# Patient Record
Sex: Male | Born: 1950 | Race: White | Hispanic: No | Marital: Married | Smoking: Former smoker
Health system: Southern US, Community
[De-identification: ages and names within clinical notes are randomized; demographics above are authoritative.]

## PROBLEM LIST (undated history)

## (undated) DIAGNOSIS — K469 Unspecified abdominal hernia without obstruction or gangrene: Secondary | ICD-10-CM

## (undated) DIAGNOSIS — R51 Headache: Secondary | ICD-10-CM

## (undated) DIAGNOSIS — Z87442 Personal history of urinary calculi: Secondary | ICD-10-CM

## (undated) DIAGNOSIS — R079 Chest pain, unspecified: Secondary | ICD-10-CM

## (undated) DIAGNOSIS — E782 Mixed hyperlipidemia: Secondary | ICD-10-CM

## (undated) DIAGNOSIS — R519 Headache, unspecified: Secondary | ICD-10-CM

## (undated) DIAGNOSIS — R0600 Dyspnea, unspecified: Secondary | ICD-10-CM

## (undated) DIAGNOSIS — I350 Nonrheumatic aortic (valve) stenosis: Secondary | ICD-10-CM

## (undated) DIAGNOSIS — I10 Essential (primary) hypertension: Secondary | ICD-10-CM

## (undated) DIAGNOSIS — R0982 Postnasal drip: Secondary | ICD-10-CM

## (undated) DIAGNOSIS — I2510 Atherosclerotic heart disease of native coronary artery without angina pectoris: Secondary | ICD-10-CM

## (undated) DIAGNOSIS — Z955 Presence of coronary angioplasty implant and graft: Secondary | ICD-10-CM

## (undated) HISTORY — PX: TONSILLECTOMY: SUR1361

## (undated) HISTORY — DX: Chest pain, unspecified: R07.9

## (undated) HISTORY — DX: Presence of coronary angioplasty implant and graft: Z95.5

## (undated) HISTORY — DX: Essential (primary) hypertension: I10

## (undated) HISTORY — DX: Mixed hyperlipidemia: E78.2

## (undated) HISTORY — PX: HERNIA REPAIR: SHX51

---

## 2008-10-18 ENCOUNTER — Inpatient Hospital Stay (HOSPITAL_BASED_OUTPATIENT_CLINIC_OR_DEPARTMENT_OTHER): Admission: RE | Admit: 2008-10-18 | Discharge: 2008-10-18 | Payer: Self-pay | Admitting: Interventional Cardiology

## 2008-10-26 ENCOUNTER — Ambulatory Visit (HOSPITAL_COMMUNITY): Admission: RE | Admit: 2008-10-26 | Discharge: 2008-10-27 | Payer: Self-pay | Admitting: Interventional Cardiology

## 2010-09-02 LAB — BASIC METABOLIC PANEL
BUN: 11 mg/dL (ref 6–23)
CO2: 24 mEq/L (ref 19–32)
Chloride: 100 mEq/L (ref 96–112)
Creatinine, Ser: 0.95 mg/dL (ref 0.4–1.5)
Glucose, Bld: 116 mg/dL — ABNORMAL HIGH (ref 70–99)

## 2010-09-02 LAB — CBC
HCT: 41.8 % (ref 39.0–52.0)
Hemoglobin: 14.6 g/dL (ref 13.0–17.0)
MCV: 88.6 fL (ref 78.0–100.0)
RBC: 4.73 MIL/uL (ref 4.22–5.81)
WBC: 10.3 10*3/uL (ref 4.0–10.5)

## 2010-09-03 LAB — POCT I-STAT 3, ART BLOOD GAS (G3+)
Acid-Base Excess: 1 mmol/L (ref 0.0–2.0)
pH, Arterial: 7.377 (ref 7.350–7.450)

## 2010-09-03 LAB — POCT I-STAT 3, VENOUS BLOOD GAS (G3P V)
O2 Saturation: 69 %
TCO2: 26 mmol/L (ref 0–100)

## 2010-10-08 NOTE — Discharge Summary (Signed)
Joseph Morse              ACCOUNT NO.:  [id number]   MEDICAL RECORD NO.:  [id number]          PATIENT TYPE:  INP   LOCATION:  2503                         FACILITY:  [facility]   PHYSICIAN:  Corky Crafts, MDDATE OF BIRTH:  07-02-1950   DATE OF ADMISSION:  10/26/2008  DATE OF DISCHARGE:  10/27/2008                               DISCHARGE SUMMARY   DISCHARGE DIAGNOSES:  1. Coronary artery disease, status post drug-eluting stent to the      right coronary artery x2.  2. Heart murmur.  3. Nonspecific abnormal electrocardiogram.  4. Hypertension.  5. Long-term medication use.   Joseph Morse is a 60 year old male patient who was referred from Dr.  Tiburcio Morse with a heart murmur and a left bundle-branch block with a history  of hypertension and family history of heart disease.  A 2-D echo was  performed and he was diagnosed with grade 1 diastolic dysfunction  without left atrial pressure elevation.  There was moderate aortic valve  regurgitation and mild-to-moderate aortic valve stenosis by echo.   He then had a nuclear stress test which was suggestive of ischemia,  leading to a catheterization on October 26, 2008.  He was found to have 80%  mid to distal right coronary artery lesion.  Two drug-eluting stents  were implanted to this area without difficulty.  He remained in the  hospital overnight and was ready for discharge to home the following  day.   LABORATORY STUDIES:  Sodium 133, potassium 4.0, BUN 11, and creatinine  0.95.  Hemoglobin 14.6, hematocrit 41.8, white count 10.3, and platelets  240.   DISCHARGE MEDICATIONS:  1. Plavix 75 mg a day.  2. Enteric-coated aspirin 325 mg a day.  3. Simvastatin 20 mg a day.  4. Lisinopril 10 mg a day.  5. Sublingual nitroglycerin p.r.n. chest pain.  6. Xanax p.r.n.   The patient is to remain on a low-sodium, heart-healthy diet.  Clean  cath site gently with soap and water.  No scrubbing.  Increase activity  slowly as per cardiac  rehabilitation.  No lifting over 10 pounds for 1  week.  No driving for 2 days.  Return to see Dr. Eldridge Dace, on November 10, 2008, at 11 a.m.  The patient is discharged to home in stable, but  improved condition.      Guy Franco, P.A.      Corky Crafts, MD  Electronically Signed    LB/MEDQ  D:  10/27/2008  T:  10/28/2008  Job:  [id number]   cc:   Joseph Morse, M.D.

## 2010-10-08 NOTE — Cardiovascular Report (Signed)
Joseph Morse, Joseph Morse              ACCOUNT NO.:  [id number]   MEDICAL RECORD NO.:  [id number]          PATIENT TYPE:  INP   LOCATION:  2503                         FACILITY:  [facility]   PHYSICIAN:  Corky Crafts, MDDATE OF BIRTH:  07/06/1950   DATE OF PROCEDURE:  10/26/2008  DATE OF DISCHARGE:                            CARDIAC CATHETERIZATION   REFERRING PHYSICIAN:  Holley Bouche, MD   PROCEDURE PERFORMED:  Percutaneous coronary intervention of the right  coronary artery, aortic arch aortogram.   OPERATOR:  Corky Crafts, MD   INDICATIONS:  Coronary artery disease, angina, bicuspid aortic valve.   PROCEDURE NARRATIVE:  The risks and benefits of cardiac catheterization  were explained to the patient and informed consent was obtained.  He was  brought to the cath lab.  He was prepped and draped in the usual sterile  fashion.  His right groin was infiltrated with 1% lidocaine.  A 6-French  sheath was placed into the right femoral artery using the modified  Seldinger technique.  A JR-4 guiding catheter was used to engage the  ostium of the right coronary artery.  A Prowater wire was placed down  the vessel and a BMW wire was then placed down the vessel.  A 2.5 x 15  apex balloon was placed across the diseased area and inflated 10  atmospheres for 23 seconds, 12 atmospheres for 42 seconds, 14  atmospheres for 32 seconds.  A 3.0 x 24-mm Endeavor stent was placed  into the more distal portion of the lesion and deployed at 15  atmospheres for 53 seconds.  A 3.0 x 18 Endeavor was then over placed in  an overlapping fashion more proximally and deployed at 16 atmospheres  for 38 seconds.  The entire stented area was postdilated with a 3.5 x 20  [place] balloon, inflated to 18 atmospheres for 30 seconds, 29  seconds, and then again for 29 seconds.  The lesion length was about 33  mm.  TIMI III flow was maintained throughout.  The 80-90% stenosis was  reduced to 0%.  The  ascending aortogram showed 2+ aortic insufficiency.  The aortic root  size looked at the upper limits of normal.  A StarClose was deployed to  the right femoral artery for hemostasis.  The patient tolerated the  procedure well.   IMPRESSION:  1. Successful percutaneous coronary intervention of the right coronary      artery with two drug-eluting stents in an overlapping fashion.      Both postdilated to greater than 3.5 mm in diameter.  2. 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   RECOMMENDATIONS:  Continue aspirin and Plavix indefinitely.  The patient  will be watched overnight in the hospital.  We will continue to follow  his bicuspid aortic valve.      Corky Crafts, MD  Electronically Signed    JSV/MEDQ  D:  10/26/2008  T:  10/26/2008  Job:  [id number]

## 2010-10-08 NOTE — Cardiovascular Report (Signed)
Joseph Morse, Joseph Morse              ACCOUNT NO.:  [id number]   MEDICAL RECORD NO.:  [id number]          PATIENT TYPE:  OIB   LOCATION:  1963                         FACILITY:  [facility]   PHYSICIAN:  Corky Crafts, MDDATE OF BIRTH:  08/27/1950   DATE OF PROCEDURE:  10/18/2008  DATE OF DISCHARGE:  10/18/2008                            CARDIAC CATHETERIZATION   REFERRING PHYSICIAN:  Holley Bouche, MD   PROCEDURES PERFORMED:  1. Left heart catheterization.  2. Right heart catheterization.  3. Coronary angiogram.  4. Left ventriculogram.  5. Abdominal aortogram.   INDICATIONS:  Chest pain, bicuspid aortic valve.   PROCEDURE NARRATIVE:  The risks and benefits of cardiac catheterization  were explained to the patient and informed consent was obtained.  He was  brought to the cath lab.  He was prepped and draped in the usual sterile  fashion.  His right groin was infiltrated with 1% lidocaine.  A 7-French  sheath was placed into the right femoral vein using the modified  Seldinger technique.  A 4-French sheath was placed into the right  femoral artery using the modified Seldinger technique.  A Swan-Ganz  catheter was advanced to the pulmonary artery under fluoroscopic  guidance.  Cardiac output measurements were performed.  PA oxygen  saturation was performed.  The catheter was pulled back under continuous  hemodynamic pressure monitoring to obtain right heart pressures.  The  catheter was removed.  A JL-4.0 catheter was used to engage the left  main coronary artery.  Digital angiography was performed in multiple  projections using hand injection of contrast.  A JR-4 catheter was used  to engage the right coronary artery.  Digital angiography was performed  in multiple projections using hand injection of contrast.  Ultimately,  an 0.035 straight wire and AL-1 catheter were used to cross the aortic  valve.  A pigtail was advanced over the J-wire which had been used to  replace a  straight wire and a left ventriculogram was performed.  The  catheter was pulled back under continuous hemodynamic pressure  monitoring.  The catheter was withdrawn to the abdominal aorta and a  power injection of contrast was performed.  The sheaths were removed  using manual compression.   FINDINGS:  The left main was a large vessel with mild irregularities.   The left circumflex was a large vessel which had an aneurysmal segment  in the mid vessel.  There appeared to be an underlying 60-70% stenosis.  The OM-1 was a large branching vessel with mild irregularities.  The  continuation of the circumflex had about a 90% ostial lesion after the  OM-1 but this was a small vessel.   The left anterior descending had mild-to-moderate diffuse  atherosclerosis.  The first and second diagonal vessels were medium-  sized vessels and widely patent.  The LAD did wrap around the apex.   The right coronary artery was a large dominant vessel with a diffuse 70-  80% midvessel stenosis.  There is mild atherosclerosis in the PDA and  posterolateral artery.   Left ventriculogram showed low normal left ventricular function with an  LVEF of 50%.   HEMODYNAMIC RESULTS:  Right atrial pressure 9/8 with a mean right atrial  pressure of 7 mmHg.  Cardiac output 4.7 L/min.  Cardiac index 2.4.  PA  saturation 69%.  PA pressure 26/8 with a mean PA pressure of 16 mmHg.  Pulmonary capillary wedge pressure 12/9 with a mean wedge of 7 mmHg.  RV  pressure 29/5 with an RVEDP of 7 mmHg.   Left ventricular pressure 145/15 with a left ventricular end-diastolic  pressure of 17 mmHg.  Aortic pressure 142/67 with a mean aortic pressure  of 95 mmHg.   Abdominal aortogram showed mild atherosclerosis and no significant renal  artery stenosis.  No abdominal aortic aneurysm.   IMPRESSION:  1. Significant coronary disease involving the right coronary artery.      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  2. There is no significant aortic stenosis.  3. Normal right heart pressures.   RECOMMENDATIONS:  We will start Plavix at this time and plan for PCI of  the right coronary artery next week.  We will try to medically manage  circumflex disease.  The aneurysm would make any type of percutaneous  intervention somewhat more complicated.  We will continue to follow  along for his bicuspid aortic valve.      Corky Crafts, MD  Electronically Signed     Corky Crafts, MD  Electronically Signed    JSV/MEDQ  D:  11/09/2008  T:  11/10/2008  Job:  [id number]

## 2013-02-22 ENCOUNTER — Other Ambulatory Visit: Payer: Self-pay | Admitting: Interventional Cardiology

## 2013-02-22 DIAGNOSIS — E782 Mixed hyperlipidemia: Secondary | ICD-10-CM

## 2013-02-22 DIAGNOSIS — Z79899 Other long term (current) drug therapy: Secondary | ICD-10-CM

## 2013-02-25 ENCOUNTER — Other Ambulatory Visit: Payer: Self-pay

## 2013-03-18 ENCOUNTER — Other Ambulatory Visit: Payer: Self-pay | Admitting: Interventional Cardiology

## 2013-04-20 ENCOUNTER — Other Ambulatory Visit: Payer: Self-pay | Admitting: Interventional Cardiology

## 2013-05-06 ENCOUNTER — Other Ambulatory Visit: Payer: Self-pay | Admitting: Interventional Cardiology

## 2013-05-22 ENCOUNTER — Other Ambulatory Visit: Payer: Self-pay | Admitting: Interventional Cardiology

## 2013-05-24 ENCOUNTER — Other Ambulatory Visit: Payer: Self-pay

## 2013-06-02 ENCOUNTER — Other Ambulatory Visit (INDEPENDENT_AMBULATORY_CARE_PROVIDER_SITE_OTHER): Payer: 59

## 2013-06-02 DIAGNOSIS — Z79899 Other long term (current) drug therapy: Secondary | ICD-10-CM

## 2013-06-02 DIAGNOSIS — E782 Mixed hyperlipidemia: Secondary | ICD-10-CM

## 2013-06-02 LAB — LIPID PANEL
CHOL/HDL RATIO: 3
Cholesterol: 124 mg/dL (ref 0–200)
HDL: 43.1 mg/dL (ref >39.00)
LDL CALC: 71 mg/dL (ref 0–99)
TRIGLYCERIDES: 52 mg/dL (ref 0.0–149.0)
VLDL: 10.4 mg/dL (ref 0.0–40.0)

## 2013-06-02 LAB — ALT: ALT: 21 U/L (ref 0–53)

## 2013-06-08 ENCOUNTER — Telehealth: Payer: Self-pay | Admitting: *Deleted

## 2013-06-08 ENCOUNTER — Telehealth: Payer: Self-pay | Admitting: Interventional Cardiology

## 2013-06-08 ENCOUNTER — Telehealth: Payer: Self-pay | Admitting: Cardiology

## 2013-06-08 DIAGNOSIS — E782 Mixed hyperlipidemia: Secondary | ICD-10-CM

## 2013-06-08 MED ORDER — ATORVASTATIN CALCIUM 40 MG PO TABS: ORAL_TABLET | ORAL | Status: DC

## 2013-06-08 NOTE — Telephone Encounter (Signed)
Labs ordered.

## 2013-06-08 NOTE — Telephone Encounter (Signed)
Message copied by Theda SersSTEGALL, Tricia [place] H on Wed Jun 08, 2013  2:17 PM ------      Message from: [place], [place]      Created: Fri Jun 03, 2013  4:38 PM       LDL goal < 70, non-HDL goal < 100,  given h/o CAD.      Meds:  Lipitor 40 mg qd.      LDL historically ~ 65 mg/dL on lipitor 40 mg qd, and up to 71 mg/dL today.  Non-HDL is well below 100 mg/dL still, so will continue same dose.      Plan:      1. No change in meds.      2. Check an NMR LipoProfile and hepatic panel in 6 months to assess LDL-P.  If > 1000 will likely titrate lipitor to 80 mg.      Please notify patient, and set up labs. Thanks. ------

## 2013-06-08 NOTE — Telephone Encounter (Signed)
New message        Pt needs results of recent lab work.

## 2013-06-08 NOTE — Telephone Encounter (Signed)
Patients wife called for lab results and also a refill on lipitor. Thanks, MI

## 2013-06-08 NOTE — Telephone Encounter (Signed)
Refilled. Pts wife aware.

## 2013-06-08 NOTE — Telephone Encounter (Signed)
LMTRC

## 2013-07-26 ENCOUNTER — Encounter: Payer: Self-pay | Admitting: Interventional Cardiology

## 2013-08-30 ENCOUNTER — Ambulatory Visit: Payer: Self-pay | Admitting: Interventional Cardiology

## 2013-10-18 ENCOUNTER — Ambulatory Visit (INDEPENDENT_AMBULATORY_CARE_PROVIDER_SITE_OTHER): Payer: 59 | Admitting: Interventional Cardiology

## 2013-10-18 ENCOUNTER — Encounter: Payer: Self-pay | Admitting: Interventional Cardiology

## 2013-10-18 VITALS — BP 144/80 | HR 73 | Ht 69.5 in | Wt 164.0 lb

## 2013-10-18 DIAGNOSIS — E782 Mixed hyperlipidemia: Secondary | ICD-10-CM

## 2013-10-18 DIAGNOSIS — Z79899 Other long term (current) drug therapy: Secondary | ICD-10-CM

## 2013-10-18 DIAGNOSIS — I10 Essential (primary) hypertension: Secondary | ICD-10-CM | POA: Insufficient documentation

## 2013-10-18 DIAGNOSIS — I2510 Atherosclerotic heart disease of native coronary artery without angina pectoris: Secondary | ICD-10-CM

## 2013-10-18 DIAGNOSIS — I359 Nonrheumatic aortic valve disorder, unspecified: Secondary | ICD-10-CM

## 2013-10-18 MED ORDER — LISINOPRIL 20 MG PO TABS: ORAL_TABLET | ORAL | Status: DC

## 2013-10-18 NOTE — Progress Notes (Signed)
Patient ID: Joseph PellegriniDaniel S Morse, male   DOB: 09-01-1950, 63 y.o.   MRN: [id number]    [address] [place], [place]  [address] Phone: [phone] Fax:  [phone]  Date:  10/18/2013   ID:  Joseph PellegriniDaniel S Morse, DOB 09-01-1950, MRN [id number]  PCP:  Johny BlamerHARRIS, WILLIAM, MD      History of Present Illness: Joseph Morse is a 63 y.o. male with moderate AS who has had RCA stenting in the past. He has a residual circumflex lesion that was medically managed. CAD/ASCVD:  walks a lot at work. He walks on the golf course, 18 holes without problems. No bleeding problems. c/o Chest pain occasional pinching in the chest.  Denies : Diaphoresis.  Dizziness.  Dyspnea on exertion.  Leg edema.  Orthopnea.  Palpitations.  Syncope.    Wt Readings from Last 3 Encounters:  No data found for Wt     No past medical history on file.  Current Outpatient Prescriptions  Medication Sig Dispense Refill  . atorvastatin (LIPITOR) 40 MG tablet TAKE 1 TABLET EVERY DAY  90 tablet  2  . clopidogrel (PLAVIX) 75 MG tablet TAKE 1 TABLET EVERY DAY  90 tablet  3  . lisinopril (PRINIVIL,ZESTRIL) 20 MG tablet 1 tablet daily  30 tablet  11   No current facility-administered medications for this visit.    Allergies:   No Known Allergies  Social History:  The patient  reports that he has quit smoking. He does not have any smokeless tobacco history on file.   Family History:  The patient's family history includes Heart attack in his father.   ROS:  Please see the history of present illness.  No nausea, vomiting.  No fevers, chills.  No focal weakness.  No dysuria.    All other systems reviewed and negative.   PHYSICAL EXAM: VS:  BP 144/80  Pulse 73  Ht 5' 9.5" (1.765 m) Well nourished, well developed, in no acute distress HEENT: normal Neck: no JVD, no carotid bruits Cardiac:  normal S1, S2; RRR; 3/6 systolic, 1/6 diastolic murmur Lungs:  clear to auscultation bilaterally, no wheezing, rhonchi or  rales Abd: soft, nontender, no hepatomegaly Ext: no edema Skin: warm and dry Neuro:   no focal abnormalities noted  EKG:   NSR, no ST segment changes  ASSESSMENT AND PLAN:  Aortic valve disorders  Notes: No sx of severe AS. follow with serial echocardiogram.    2. Coronary atherosclerosis of native coronary artery  Decrease Aspirin Tablet, 81 MG, 1 tablet, Orally, Once a day Continue Plavix Tablet, 75 MG, 1 tablet, Orally, Once a day IMAGING: EKG    Harward,Amy 08/31/2012 02:55:40 PM > VARANASI,JAY 08/31/2012 03:30:34 PM > NSR, NSST   Notes: No angina. residual disease in the circumflex. No clear indication for revascularization at this time as he is doing well on medical therapy.    3. Hypertension, essential  Increasee Lisinopril Tablet, 20 MG, 1 tablet, Orally, qd Notes: Check BP at home. Most readings > 140/90. Like to see blood pressure readings lower. Check electrolytes in a week.    4. Mixed hyperlipidemia  Continue Atorvastatin Calcium Tablet, 40 MG, 1 tablet, Orally, Once a day Notes: 7/13. LDL 66. LDL 71, TG 52 in 1 /15.   Preventive Medicine  Adult topics discussed:  Exercise: 5 days a week, at least 30 minutes of aerobic exercise. Encouraged him to try to vary his exercise routine. He should try to use a stationary bike  for a change of pace.      Signed, Fredric Mare, MD, [facility] 10/18/2013 5:25 PM

## 2013-10-18 NOTE — Patient Instructions (Addendum)
Your physician recommends that you return for lab work in 1 week on 10/25/13 for BMET.  Your physician recommends that you return for a FASTING NMR LIPO AND HEPATIC AS SCHEDULED.   Your physician has recommended you make the following change in your medication:   1. Increase lisinopril to 20 mg daily.   Your physician wants you to follow-up in: 1 year with Dr. Eldridge Dace. You will receive a reminder letter in the mail two months in advance. If you don't receive a letter, please call our office to schedule the follow-up appointment.

## 2013-10-19 NOTE — Addendum Note (Signed)
Addended byOrlene Plum H on: 10/19/2013 07:46 AM   Modules accepted: Orders

## 2013-10-25 ENCOUNTER — Other Ambulatory Visit: Payer: 59

## 2013-12-01 ENCOUNTER — Other Ambulatory Visit: Payer: 59

## 2014-03-07 ENCOUNTER — Other Ambulatory Visit: Payer: Self-pay | Admitting: Interventional Cardiology

## 2014-05-03 ENCOUNTER — Other Ambulatory Visit: Payer: Self-pay | Admitting: *Deleted

## 2014-05-03 MED ORDER — CLOPIDOGREL BISULFATE 75 MG PO TABS: 75.0000 mg | ORAL_TABLET | Freq: Every day | ORAL | Status: DC

## 2014-10-14 ENCOUNTER — Other Ambulatory Visit: Payer: Self-pay | Admitting: Interventional Cardiology

## 2014-10-27 ENCOUNTER — Telehealth: Payer: Self-pay | Admitting: Interventional Cardiology

## 2014-10-27 NOTE — Telephone Encounter (Signed)
Will forward to Dr. Eldridge DaceVaranasi for review and advisement on holding Plavix and ASA.

## 2014-10-27 NOTE — Telephone Encounter (Signed)
Spoke with Dr. Eldridge DaceVaranasi and he would like for pt to continue ASA but ok to stop Plavix starting today. Called Dr. Leretha PolKenn with these orders and he was fine with this plan.

## 2014-10-27 NOTE — Telephone Encounter (Signed)
New message    1. What dental office are you calling from? Dr. Bernestine Amassary Kenn   2. What is your office phone and fax number? MD cell phone  [phone]  3. What type of procedure is the patient having performed? Dental procedure  4. What date is procedure scheduled? 6.7.2016   5. What is your question (ex. Antibiotics prior to procedure, holding medication-we need to know how long dentist wants pt to hold med)?  Please advise on  plavix or asa.

## 2014-11-02 ENCOUNTER — Telehealth: Payer: Self-pay | Admitting: Interventional Cardiology

## 2014-11-02 NOTE — Telephone Encounter (Signed)
Called pt and wife answered.  Left message to have pt call back. Pt was due back to see Dr. Eldridge Dace in May 2016.   Need to schedule OV appt and lab appt (Lipid, liver and bmet)

## 2014-11-08 ENCOUNTER — Other Ambulatory Visit: Payer: Self-pay | Admitting: Interventional Cardiology

## 2014-11-09 NOTE — Telephone Encounter (Signed)
Left message to call back  

## 2014-11-11 ENCOUNTER — Other Ambulatory Visit: Payer: Self-pay | Admitting: Interventional Cardiology

## 2014-11-15 ENCOUNTER — Encounter: Payer: Self-pay | Admitting: *Deleted

## 2014-11-15 NOTE — Telephone Encounter (Signed)
Letter mailed

## 2014-12-07 ENCOUNTER — Telehealth: Payer: Self-pay | Admitting: Interventional Cardiology

## 2014-12-07 DIAGNOSIS — E782 Mixed hyperlipidemia: Secondary | ICD-10-CM

## 2014-12-07 NOTE — Telephone Encounter (Signed)
error 

## 2014-12-07 NOTE — Telephone Encounter (Signed)
Left message to call back  

## 2014-12-07 NOTE — Telephone Encounter (Signed)
Follow Up ° °Pt returned call//  °

## 2014-12-07 NOTE — Telephone Encounter (Signed)
Spoke with pt and scheduled labs for 01/17/15. Pt verbalized understanding and was in agreement with this plan.

## 2014-12-07 NOTE — Telephone Encounter (Signed)
New message  Please put in orders for September appt for fasting labs

## 2014-12-09 ENCOUNTER — Other Ambulatory Visit: Payer: Self-pay | Admitting: Interventional Cardiology

## 2014-12-18 ENCOUNTER — Other Ambulatory Visit: Payer: Self-pay | Admitting: Interventional Cardiology

## 2015-01-06 ENCOUNTER — Other Ambulatory Visit: Payer: Self-pay | Admitting: Interventional Cardiology

## 2015-01-17 ENCOUNTER — Other Ambulatory Visit (INDEPENDENT_AMBULATORY_CARE_PROVIDER_SITE_OTHER): Payer: 59 | Admitting: *Deleted

## 2015-01-17 DIAGNOSIS — E782 Mixed hyperlipidemia: Secondary | ICD-10-CM | POA: Diagnosis not present

## 2015-01-17 LAB — LIPID PANEL
CHOL/HDL RATIO: 3
Cholesterol: 110 mg/dL (ref 0–200)
HDL: 38 mg/dL — ABNORMAL LOW (ref >39.00)
LDL Cholesterol: 62 mg/dL (ref 0–99)
NONHDL: 71.51
TRIGLYCERIDES: 46 mg/dL (ref 0.0–149.0)
VLDL: 9.2 mg/dL (ref 0.0–40.0)

## 2015-01-17 LAB — HEPATIC FUNCTION PANEL
ALK PHOS: 51 U/L (ref 39–117)
ALT: 23 U/L (ref 0–53)
AST: 23 U/L (ref 0–37)
Albumin: 4.3 g/dL (ref 3.5–5.2)
BILIRUBIN DIRECT: 0.2 mg/dL (ref 0.0–0.3)
Total Bilirubin: 0.8 mg/dL (ref 0.2–1.2)
Total Protein: 6.7 g/dL (ref 6.0–8.3)

## 2015-01-26 ENCOUNTER — Encounter: Payer: Self-pay | Admitting: Interventional Cardiology

## 2015-01-26 ENCOUNTER — Ambulatory Visit (INDEPENDENT_AMBULATORY_CARE_PROVIDER_SITE_OTHER): Payer: 59 | Admitting: Interventional Cardiology

## 2015-01-26 VITALS — BP 136/82 | HR 55 | Ht 69.5 in | Wt 169.4 lb

## 2015-01-26 DIAGNOSIS — E782 Mixed hyperlipidemia: Secondary | ICD-10-CM

## 2015-01-26 DIAGNOSIS — I359 Nonrheumatic aortic valve disorder, unspecified: Secondary | ICD-10-CM | POA: Diagnosis not present

## 2015-01-26 DIAGNOSIS — I2510 Atherosclerotic heart disease of native coronary artery without angina pectoris: Secondary | ICD-10-CM

## 2015-01-26 DIAGNOSIS — I10 Essential (primary) hypertension: Secondary | ICD-10-CM | POA: Diagnosis not present

## 2015-01-26 NOTE — Patient Instructions (Signed)
Medication Instructions:  1) Try taking Melatonin for your insomnia  Labwork: None  Testing/Procedures: None  Follow-Up: Your physician wants you to follow-up in: 1 year with Dr. Eldridge Dace. You will receive a reminder letter in the mail two months in advance. If you don't receive a letter, please call our office to schedule the follow-up appointment.   Any Other Special Instructions Will Be Listed Below (If Applicable).

## 2015-01-26 NOTE — Progress Notes (Signed)
Patient ID: Joseph Morse, male   DOB: 1951-04-18, 64 y.o.   MRN: [id number]     Cardiology Office Note   Date:  01/26/2015   ID:  Joseph Morse, DOB 10/15/1950, MRN [id number]  PCP:  Joseph Blamer, MD    No chief complaint on file.    Wt Readings from Last 3 Encounters:  01/26/15 169 lb 6.4 oz (76.839 kg)  10/18/13 164 lb (74.39 kg)       History of Present Illness: Joseph Morse is a 64 y.o. male  with moderate AS who has had RCA stenting in the past. He has a residual circumflex lesion that was medically managed. CAD/ASCVD:  walks a lot at work. He walks on the golf course, 18 holes without problems. No bleeding problems. c/o Chest pain occasional pinching in the chest.- Not related to exertion. No change for many years.  Denies : Diaphoresis.  Dizziness.  Dyspnea on exertion.  Leg edema.  Orthopnea.  Palpitations.  Syncope.   He walks about 8 miles per day on his job.   Past Medical History  Diagnosis Date  . S/P right coronary artery (RCA) stent placement   . Chest pain   . HTN (hypertension)   . Hyperlipidemia, mixed     History reviewed. No pertinent past surgical history.   Current Outpatient Prescriptions  Medication Sig Dispense Refill  . aspirin 81 MG tablet Take 81 mg by mouth daily.    Marland Kitchen atorvastatin (LIPITOR) 40 MG tablet TAKE 1 TABLET EVERY DAY 90 tablet 2  . clopidogrel (PLAVIX) 75 MG tablet TAKE 1 TABLET BY MOUTH EVERY DAY 90 tablet 0  . lisinopril (PRINIVIL,ZESTRIL) 20 MG tablet Take 20 mg by mouth daily.     No current facility-administered medications for this visit.    Allergies:   Review of patient's allergies indicates no known allergies.    Social History:  The patient  reports that he has quit smoking. He does not have any smokeless tobacco history on file.   Family History:  The patient's family history includes CVA in his mother; Cancer in his maternal grandfather; Heart attack in his father; Hyperlipidemia in his  sister.    ROS:  Please see the history of present illness.   Otherwise, review of systems are positive for feeling tired at the end of the day, insomnia.   All other systems are reviewed and negative.    PHYSICAL EXAM: VS:  BP 136/82 mmHg  Pulse 55  Ht 5' 9.5" (1.765 m)  Wt 169 lb 6.4 oz (76.839 kg)  BMI 24.67 kg/m2  SpO2 97% , BMI Body mass index is 24.67 kg/(m^2). GEN: Well nourished, well developed, in no acute distress HEENT: normal Neck: no JVD, carotid bruits, or masses Cardiac: RRR; no murmurs, rubs, or gallops,no edema  Respiratory:  clear to auscultation bilaterally, normal work of breathing GI: soft, nontender, nondistended, + BS MS: no deformity or atrophy Skin: warm and dry, no rash Neuro:  Strength and sensation are intact Psych: euthymic mood, full affect   EKG:   The ekg ordered today demonstrates sinus bradycardia with prolonged PR intervals. No ST segment changes   Recent Labs: 01/17/2015: ALT 23   Lipid Panel    Component Value Date/Time   CHOL 110 01/17/2015 0824   TRIG 46.0 01/17/2015 0824   HDL 38.00* 01/17/2015 0824   CHOLHDL 3 01/17/2015 0824   VLDL 9.2 01/17/2015 0824   LDLCALC 62 01/17/2015 0824     Other  studies Reviewed: Additional studies/ records that were reviewed today with results demonstrating: 2014 echocardiogram showing bicuspid, calcified aortic valve with mild to moderate left ear and mild AI.Marland Kitchen   ASSESSMENT AND PLAN:  1. Coronary artery disease: No angina. Continue aggressive secondary prevention. No symptoms from his circumflex disease at this time. Continue dual antiplatelet therapy. No bleeding issues. 2. Hypertension: Blood pressure well controlled. Continue current medicines. 3. Hyperlipidemia: Lipids recently checked and reviewed. They're well controlled. Continue current lipid-lowering therapy. 4. Bicuspid aortic valve: No symptoms of severe aortic stenosis. He will let us know if he does develop symptoms such as  shortness of breath, chest discomfort, lightheadedness or syncope. 5. Insomnia: Trouble falling asleep. I recommend that he try over-the-counter melatonin.   Current medicines are reviewed at length with the patient today.  The patient concerns regarding his medicines were addressed.  The following changes have been made:  No change  Labs/ tests ordered today include:  No orders of the defined types were placed in this encounter.    Recommend 150 minutes/week of aerobic exercise Low fat, low carb, high fiber diet recommended  Disposition:   FU in one year   Delorise Jackson., MD  01/26/2015 3:03 PM    [facility] [address] [place], [place], [place]  [address] Phone: [phone]; Fax: [phone]

## 2015-01-31 ENCOUNTER — Other Ambulatory Visit: Payer: Self-pay | Admitting: Interventional Cardiology

## 2015-03-12 ENCOUNTER — Other Ambulatory Visit: Payer: Self-pay | Admitting: Interventional Cardiology

## 2015-03-17 ENCOUNTER — Other Ambulatory Visit: Payer: Self-pay | Admitting: Interventional Cardiology

## 2015-07-30 ENCOUNTER — Other Ambulatory Visit: Payer: Self-pay

## 2015-07-30 MED ORDER — ATORVASTATIN CALCIUM 40 MG PO TABS: 40.0000 mg | ORAL_TABLET | Freq: Every day | ORAL | Status: DC

## 2015-07-30 MED ORDER — LISINOPRIL 20 MG PO TABS: 20.0000 mg | ORAL_TABLET | Freq: Every day | ORAL | Status: DC

## 2015-07-30 MED ORDER — CLOPIDOGREL BISULFATE 75 MG PO TABS: 75.0000 mg | ORAL_TABLET | Freq: Every day | ORAL | Status: DC

## 2016-03-26 ENCOUNTER — Telehealth: Payer: Self-pay | Admitting: Interventional Cardiology

## 2016-03-26 NOTE — Telephone Encounter (Signed)
Pt requesting refills Lisinopril 20mg  and Atorvastatin 40mg  [facility] [phone][facility]

## 2016-03-26 NOTE — Telephone Encounter (Signed)
PT HAS REFILLS ON FILE, ADVISED PT OF THIS VIA VM.

## 2016-04-13 NOTE — Progress Notes (Signed)
Patient ID: Joseph PellegriniDaniel S Boehner, male   DOB: [birth year], 65 y.o.   MRN: [id number]     Cardiology Office Note   Date:  04/15/2016   ID:  Joseph PellegriniDaniel S Mckimmy, DOB [birth year], MRN [id number]  PCP:  Johny BlamerHARRIS, WILLIAM, MD    No chief complaint on file.  CAD, AVR  Wt Readings from Last 3 Encounters:  04/15/16 78.5 kg (173 lb)  01/26/15 76.8 kg (169 lb 6.4 oz)  10/18/13 74.4 kg (164 lb)       History of Present Illness: Joseph PellegriniDaniel S Obarr is a 65 y.o. male  with moderate AS who has had RCA stenting in the past. He has a residual circumflex lesion that was medically managed. CAD/ASCVD:  walks a lot at work. He walks [place] the [place], 18 holes without problems. No bleeding problems. c/o Chest pain occasional pinching in the chest.- infrequent. No change for many years.  Denies : Diaphoresis. Dizziness. Dyspnea on exertion. Leg edema. Orthopnea. Palpitations. Syncope.   He walks about 9 miles per day on his job.  Typically > 19000 steps.   Past Medical History:  Diagnosis Date  . Chest pain   . HTN (hypertension)   . Hyperlipidemia, mixed   . S/P right coronary artery (RCA) stent placement     No past surgical history on file.   Current Outpatient Prescriptions  Medication Sig Dispense Refill  . aspirin 81 MG tablet Take 81 mg by mouth daily.    Marland Kitchen. atorvastatin (LIPITOR) 40 MG tablet Take 1 tablet (40 mg total) by mouth daily. 30 tablet 9  . clopidogrel (PLAVIX) 75 MG tablet Take 1 tablet (75 mg total) by mouth daily. 90 tablet 3  . lisinopril (PRINIVIL,ZESTRIL) 20 MG tablet Take 1 tablet (20 mg total) by mouth daily. 90 tablet 3   No current facility-administered medications for this visit.     Allergies:   Patient has no known allergies.    Social History:  The patient  reports that he has quit smoking. He does not have any smokeless tobacco history on file.   Family History:  The patient's family history includes CVA in his mother; Cancer in his maternal grandfather; Heart  attack in his father; Hyperlipidemia in his sister.    ROS:  Please see the history of present illness.   Otherwise, review of systems are positive for feeling tired at the end of the day, insomnia improved with melatonin.   All other systems are reviewed and negative.    PHYSICAL EXAM: VS:  BP (!) 144/78   Pulse 61   Ht 5' 9.5" (1.765 m)   Wt 78.5 kg (173 lb)   BMI 25.18 kg/m  , BMI Body mass index is 25.18 kg/m. GEN: Well nourished, well developed, in no acute distress  HEENT: normal  Neck: no JVD, carotid bruits, or masses Cardiac: RRR; 3/6 systolic murmurs, rubs, or gallops,no edema ; normal carotid upstroke Respiratory:  clear to auscultation bilaterally, normal work of breathing GI: soft, nontender, nondistended, + BS MS: no deformity or atrophy  Skin: warm and dry, no rash Neuro:  Strength and sensation are intact Psych: euthymic mood, full affect   EKG:   The ekg ordered today demonstrates sinus bradycardia with prolonged PR intervals. No ST segment changes   Recent Labs: No results found for requested labs within last 8760 hours.   Lipid Panel    Component Value Date/Time   CHOL 110 01/17/2015 0824   TRIG 46.0 01/17/2015 0824  HDL 38.00 (L) 01/17/2015 0824   CHOLHDL 3 01/17/2015 0824   VLDL 9.2 01/17/2015 0824   LDLCALC 62 01/17/2015 0824     Other studies Reviewed: Additional studies/ records that were reviewed today with results demonstrating: 2014 echocardiogram showing bicuspid, calcified aortic valve with mild to moderate AS and mild AI.Marland Kitchen.   ASSESSMENT AND PLAN:  1. Coronary artery disease: No angina. Not using NTG.  Continue aggressive secondary prevention. No symptoms from his circumflex disease at this time. Continue dual antiplatelet therapy. No bleeding issues. 2. Hypertension: Blood pressure fairly well controlled. Continue current medicines.   3. Hyperlipidemia: Lipids to be checked when fasting. They're well controlled. Continue current  lipid-lowering therapy. 4. Bicuspid aortic valve: No symptoms of severe aortic stenosis. He will let us know if he does develop symptoms such as shortness of breath, chest discomfort, lightheadedness or syncope. 5. Insomnia: Resolved after starting OTC melatonin. 6. ED: OK to try Viagra 100 mg tabs as directed.  Will start with a half tab 1 hour before intercourse.      Current medicines are reviewed at length with the patient today.  The patient concerns regarding his medicines were addressed.  The following changes have been made:  No change  Labs/ tests ordered today include:  No orders of the defined types were placed in this encounter.   Recommend 150 minutes/week of aerobic exercise Low fat, low carb, high fiber diet recommended  Disposition:   FU in one year   Signed, Lance MussJayadeep Tagg Eustice, MD  04/15/2016 4:04 PM    [facility] [address], [place], [place]  [address] Phone: [phone](336) [phone]; Fax: [phone](336) [phone]

## 2016-04-15 ENCOUNTER — Ambulatory Visit (INDEPENDENT_AMBULATORY_CARE_PROVIDER_SITE_OTHER): Payer: 59 | Admitting: Interventional Cardiology

## 2016-04-15 ENCOUNTER — Encounter: Payer: Self-pay | Admitting: Interventional Cardiology

## 2016-04-15 ENCOUNTER — Encounter (INDEPENDENT_AMBULATORY_CARE_PROVIDER_SITE_OTHER): Payer: Self-pay

## 2016-04-15 VITALS — BP 144/78 | HR 61 | Ht 69.5 in | Wt 173.0 lb

## 2016-04-15 DIAGNOSIS — I359 Nonrheumatic aortic valve disorder, unspecified: Secondary | ICD-10-CM | POA: Diagnosis not present

## 2016-04-15 DIAGNOSIS — I2510 Atherosclerotic heart disease of native coronary artery without angina pectoris: Secondary | ICD-10-CM

## 2016-04-15 DIAGNOSIS — E782 Mixed hyperlipidemia: Secondary | ICD-10-CM

## 2016-04-15 DIAGNOSIS — I10 Essential (primary) hypertension: Secondary | ICD-10-CM | POA: Diagnosis not present

## 2016-04-15 MED ORDER — SILDENAFIL CITRATE 100 MG PO TABS: 100.0000 mg | ORAL_TABLET | Freq: Every day | ORAL | 3 refills | Status: DC | PRN

## 2016-04-15 NOTE — Patient Instructions (Signed)
**Note De-Identified Doll Frazee Obfuscation** Medication Instructions:  Start taking Viagra 100 mg as needed. All other medications remain the same.  Labwork: CMET and Lipids on 04/24/16 anytime between 7:30 and 5:00. Please do not eat or drink after midnight the night before labs are drawn.  Testing/Procedures: None  Follow-Up: Your physician wants you to follow-up in: 1 year. You will receive a reminder letter in the mail two months in advance. If you don't receive a letter, please call our office to schedule the follow-up appointment.     If you need a refill on your cardiac medications before your next appointment, please call your pharmacy.

## 2016-04-16 ENCOUNTER — Telehealth: Payer: Self-pay | Admitting: Interventional Cardiology

## 2016-04-16 NOTE — Telephone Encounter (Signed)
Spoke with wife and advised her to contact her insurance company to see which medications like viagra that they cover. Wife verbalized understanding of plan.

## 2016-04-16 NOTE — Telephone Encounter (Signed)
New Message  Pt wife insurance will not cover medication prescribed to pt on 11/21. Please call back to discuss an alternate medication. Please call back to discuss

## 2016-04-22 ENCOUNTER — Telehealth: Payer: Self-pay

## 2016-04-22 MED ORDER — TADALAFIL 10 MG PO TABS: 10.0000 mg | ORAL_TABLET | Freq: Every day | ORAL | 6 refills | Status: DC | PRN

## 2016-04-22 NOTE — Telephone Encounter (Signed)
Cialis 10 mg #10 with 6 refills sent to [facility] in [place] and the pt is aware.

## 2016-04-22 NOTE — Telephone Encounter (Signed)
OK to call in Cialis 10 mg. Disp 10, RF 6

## 2016-04-22 NOTE — Telephone Encounter (Signed)
The pt cannot afford Viagra. His pharmacy recommends changing RX to Cialis.  Please advise.

## 2016-04-24 ENCOUNTER — Other Ambulatory Visit: Payer: 59 | Admitting: *Deleted

## 2016-04-24 DIAGNOSIS — E782 Mixed hyperlipidemia: Secondary | ICD-10-CM

## 2016-04-24 LAB — LIPID PANEL
CHOL/HDL RATIO: 2.5 ratio (ref <5.0)
CHOLESTEROL: 103 mg/dL (ref <200)
HDL: 41 mg/dL (ref >40)
LDL Cholesterol: 53 mg/dL (ref <100)
TRIGLYCERIDES: 46 mg/dL (ref <150)
VLDL: 9 mg/dL (ref <30)

## 2016-04-24 LAB — COMPREHENSIVE METABOLIC PANEL
ALBUMIN: 4.2 g/dL (ref 3.6–5.1)
ALT: 18 U/L (ref 9–46)
AST: 20 U/L (ref 10–35)
Alkaline Phosphatase: 55 U/L (ref 40–115)
BUN: 20 mg/dL (ref 7–25)
CHLORIDE: 106 mmol/L (ref 98–110)
CO2: 25 mmol/L (ref 20–31)
Calcium: 9 mg/dL (ref 8.6–10.3)
Creat: 1.08 mg/dL (ref 0.70–1.25)
Glucose, Bld: 82 mg/dL (ref 65–99)
POTASSIUM: 4.3 mmol/L (ref 3.5–5.3)
Sodium: 141 mmol/L (ref 135–146)
TOTAL PROTEIN: 6.1 g/dL (ref 6.1–8.1)
Total Bilirubin: 0.9 mg/dL (ref 0.2–1.2)

## 2016-04-28 ENCOUNTER — Telehealth: Payer: Self-pay | Admitting: Interventional Cardiology

## 2016-04-28 NOTE — Telephone Encounter (Signed)
Pt's wife Agustin CreeDarlene returning Melinda's call-pls call [phone]

## 2016-04-28 NOTE — Telephone Encounter (Signed)
Advised wife of labs   Wife stated that when her husband went to get Cialis Rx was $400 with insurance Wanted to know if there was something else that could be called in  Will forward to Dr Eldridge DaceVaranasi for review

## 2016-04-28 NOTE — Telephone Encounter (Signed)
**Note De-Identified Bufford Helms Obfuscation** I received a fax from [facility] last week stating that we should consider changing Viagra to Cialis due to the cost of Viagra of $600 a month. We switched to Cialis 10 mg to be taken PRN.  The pts wife is calling back today asking for a less expensive ED medication as Cialis will cost $400 a month. I advised her that I would discuss with Dr Eldridge DaceVaranasi and call her back if there is another ED medication that the pt can take.  I called the pts pharmacy, [facility], and asked what the cost of Levitra would be and was advised that they have to have the correct dose to run it through the pts insurance so they could not advise if Levitra will cost less.  The only other ED medications that I can find information on is Levitra or Avanafil. Please advise.

## 2016-04-28 NOTE — Telephone Encounter (Signed)
What is the least expensive ED med available to him?

## 2016-04-28 NOTE — Telephone Encounter (Signed)
-----   Message from Corky CraftsJayadeep S Varanasi, MD sent at 04/25/2016 12:12 PM EST ----- Electrolytes, liver and lipids all well controlled

## 2016-04-29 NOTE — Telephone Encounter (Signed)
Unfortunately no ED medications are cheap. Would advise pt to call his insurance company to see if either Viagra, Levitra, or Cialis is a lower tier than the others. Usually patients will pick up a few tablets as needed to help with cost. Only other option is to send in for sildenafil 20mg  tablets, however this is off label use. The 20mg  dose is approved for pulmonary hypertension, not ED. The Viagra doses (25, 50, and 100mg ) are not yet available generically. This would be up to Dr Eldridge DaceVaranasi and his comfort level in off-label prescribing. There is a chance insurance will require a prior authorization that would prevent pt from using generic sildenafil dose 20mg  though.

## 2016-04-30 NOTE — Telephone Encounter (Signed)
We can reduce the number of pills from 10 to 2.  THat way, he can see if the medicine is even effective. THis may help with cost.  OK to try Cialis 10 mg PO 1 hour prior to intercourse.  Disp 2 tabs.

## 2016-05-01 MED ORDER — TADALAFIL 10 MG PO TABS: 10.0000 mg | ORAL_TABLET | ORAL | 6 refills | Status: DC | PRN

## 2016-05-01 NOTE — Telephone Encounter (Signed)
**Note De-Identified Emeli Goguen Obfuscation** The pts wife is advised and she is in agreement with plan to get 2 tablets. Cialis 10 mg #2 with 3 [facility] to [facility] per the pts wife requests.

## 2016-11-05 ENCOUNTER — Other Ambulatory Visit: Payer: Self-pay | Admitting: Interventional Cardiology

## 2017-04-03 ENCOUNTER — Encounter: Payer: Self-pay | Admitting: Interventional Cardiology

## 2017-04-11 NOTE — Progress Notes (Signed)
Cardiology Office Note   Date:  04/14/2017   ID:  Joseph PellegriniDaniel S Camplin, DOB 09-13-1950, MRN [id number]  PCP:  Johny BlamerHarris, William, MD    No chief complaint on file. CAD, aortic stenosis   Wt Readings from Last 3 Encounters:  04/14/17 171 lb 9.6 oz (77.8 kg)  04/15/16 173 lb (78.5 kg)  01/26/15 169 lb 6.4 oz (76.8 kg)       History of Present Illness: Joseph Morse is a 66 y.o. male  with moderate AS who has had RCA stenting in the past. He has a residual circumflex lesion that was medically managed.  He continues to walk about 9 miles per day on his job.  Typically > 19000 steps.  No problems with that activity.  He had an episode in July 2018 where he got lightheaded and chest discomfort on the left side of his chest, while walking in very hot weather.  He rested, took a NTG and sx went away.  He think he did not drink a lot of water.    He continues to walk at work for exercise.  He is considering retirement next year.     Past Medical History:  Diagnosis Date  . Chest pain   . HTN (hypertension)   . Hyperlipidemia, mixed   . S/P right coronary artery (RCA) stent placement     History reviewed. No pertinent surgical history.   Current Outpatient Medications  Medication Sig Dispense Refill  . aspirin 81 MG tablet Take 81 mg by mouth daily.    Marland Kitchen. atorvastatin (LIPITOR) 40 MG tablet TAKE 1 TABLET BY MOUTH EVERY DAY 90 tablet 1  . clopidogrel (PLAVIX) 75 MG tablet TAKE 1 TABLET BY MOUTH EVERY DAY 90 tablet 1  . lisinopril (PRINIVIL,ZESTRIL) 20 MG tablet TAKE 1 TABLET BY MOUTH EVERY DAY 90 tablet 1  . tadalafil (CIALIS) 10 MG tablet Take 1 tablet (10 mg total) by mouth as needed for erectile dysfunction. 2 tablet 6   No current facility-administered medications for this visit.     Allergies:   Patient has no known allergies.    Social History:  The patient  reports that he has quit smoking. he has never used smokeless tobacco. He reports that he does not drink  alcohol or use drugs.   Family History:  The patient's family history includes CVA in his mother; Cancer in his maternal grandfather; Heart attack in his father; Hyperlipidemia in his sister.    ROS:  Please see the history of present illness.   Otherwise, review of systems are positive for one episode of chest discomfort.   All other systems are reviewed and negative.    PHYSICAL EXAM: VS:  BP 132/78   Pulse 69   Ht 5' 9.5" (1.765 m)   Wt 171 lb 9.6 oz (77.8 kg)   SpO2 95%   BMI 24.98 kg/m  , BMI Body mass index is 24.98 kg/m. GEN: Well nourished, well developed, in no acute distress  HEENT: normal  Neck: no JVD, carotid bruits, or masses Cardiac: RRR; 3/6 systolic murmurs, ; no rubs, or gallops,no edema  Respiratory:  clear to auscultation bilaterally, normal work of breathing GI: soft, nontender, nondistended, + BS MS: no deformity or atrophy  Skin: warm and dry, no rash Neuro:  Strength and sensation are intact Psych: euthymic mood, full affect   EKG:   The ekg ordered today demonstrates NSR, LBBB   Recent Labs: 04/24/2016: ALT 18; BUN 20; Creat 1.08;  Potassium 4.3; Sodium 141   Lipid Panel    Component Value Date/Time   CHOL 103 04/24/2016 0911   TRIG 46 04/24/2016 0911   HDL 41 04/24/2016 0911   CHOLHDL 2.5 04/24/2016 0911   VLDL 9 04/24/2016 0911   LDLCALC 53 04/24/2016 0911     Other studies Reviewed: Additional studies/ records that were reviewed today with results demonstrating: 2014 echo, normal LV function, aortic valve regurgitation and stenosis.   ASSESSMENT AND PLAN:  1. CAD: On episode of angina when he was walking in the heat. 2. HTN: Blood pressure control.  Continue current medications. 3. Bicuspid aortic valve: Check echocardiogram.  Last echo was in 2014.  Given that one episode, would like to see if aortic valve morphology has changed. 4. Hyperlipidemia: Continue lipid-lowering therapy.  LDL 53 in November 2017.  He will need follow-up  lipids.  He will be following up with Dr. Tiburcio PeaHarris.  If it is not down there, will schedule lipids here. 5. ED: Given the OK to use viagra in the past.    Current medicines are reviewed at length with the patient today.  The patient concerns regarding his medicines were addressed.  The following changes have been made:  No change  Labs/ tests ordered today include: Echocardiogram No orders of the defined types were placed in this encounter.   Recommend 150 minutes/week of aerobic exercise Low fat, low carb, high fiber diet recommended  Disposition:   FU based on echocardiogram results.  If there is a significant abnormality on echocardiogram, may need to pursue cardiac cath.   Signed, Lance MussJayadeep Jozalyn Baglio, MD  04/14/2017 4:40 PM    [facility] [address], [place], [place]  [address] Phone: [phone]([phone]; Fax: [phone]

## 2017-04-14 ENCOUNTER — Encounter (INDEPENDENT_AMBULATORY_CARE_PROVIDER_SITE_OTHER): Payer: Self-pay

## 2017-04-14 ENCOUNTER — Encounter: Payer: Self-pay | Admitting: Interventional Cardiology

## 2017-04-14 ENCOUNTER — Ambulatory Visit: Payer: 59 | Admitting: Interventional Cardiology

## 2017-04-14 VITALS — BP 132/78 | HR 69 | Ht 69.5 in | Wt 171.6 lb

## 2017-04-14 DIAGNOSIS — E782 Mixed hyperlipidemia: Secondary | ICD-10-CM

## 2017-04-14 DIAGNOSIS — I10 Essential (primary) hypertension: Secondary | ICD-10-CM | POA: Diagnosis not present

## 2017-04-14 DIAGNOSIS — I25118 Atherosclerotic heart disease of native coronary artery with other forms of angina pectoris: Secondary | ICD-10-CM

## 2017-04-14 DIAGNOSIS — I359 Nonrheumatic aortic valve disorder, unspecified: Secondary | ICD-10-CM

## 2017-04-14 NOTE — Patient Instructions (Signed)
Medication Instructions:  Your physician recommends that you continue on your current medications as directed. Please refer to the Current Medication list given to you today.   Labwork: None ordered  Testing/Procedures: Your physician has requested that you have an echocardiogram. Echocardiography is a painless test that uses sound waves to create images of your heart. It provides your doctor with information about the size and shape of your heart and how well your heart's chambers and valves are working. This procedure takes approximately one hour. There are no restrictions for this procedure.  Follow-Up: Based on echocardiogram results   Any Other Special Instructions Will Be Listed Below (If Applicable).     If you need a refill on your cardiac medications before your next appointment, please call your pharmacy.

## 2017-04-23 ENCOUNTER — Other Ambulatory Visit: Payer: Self-pay

## 2017-04-23 ENCOUNTER — Ambulatory Visit (HOSPITAL_COMMUNITY): Payer: 59 | Attending: Internal Medicine

## 2017-04-23 DIAGNOSIS — I10 Essential (primary) hypertension: Secondary | ICD-10-CM | POA: Diagnosis not present

## 2017-04-23 DIAGNOSIS — I359 Nonrheumatic aortic valve disorder, unspecified: Secondary | ICD-10-CM

## 2017-04-23 DIAGNOSIS — E785 Hyperlipidemia, unspecified: Secondary | ICD-10-CM | POA: Diagnosis not present

## 2017-04-23 DIAGNOSIS — I352 Nonrheumatic aortic (valve) stenosis with insufficiency: Secondary | ICD-10-CM | POA: Diagnosis not present

## 2017-04-23 DIAGNOSIS — R079 Chest pain, unspecified: Secondary | ICD-10-CM | POA: Diagnosis not present

## 2017-05-11 ENCOUNTER — Other Ambulatory Visit: Payer: Self-pay | Admitting: Interventional Cardiology

## 2018-04-27 ENCOUNTER — Other Ambulatory Visit: Payer: Self-pay | Admitting: Interventional Cardiology

## 2018-05-26 ENCOUNTER — Other Ambulatory Visit: Payer: Self-pay | Admitting: Interventional Cardiology

## 2018-05-30 ENCOUNTER — Other Ambulatory Visit: Payer: Self-pay | Admitting: Interventional Cardiology

## 2018-06-23 ENCOUNTER — Other Ambulatory Visit: Payer: Self-pay | Admitting: Interventional Cardiology

## 2018-06-28 ENCOUNTER — Other Ambulatory Visit: Payer: Self-pay | Admitting: Interventional Cardiology

## 2018-07-07 ENCOUNTER — Telehealth: Payer: Self-pay | Admitting: Interventional Cardiology

## 2018-07-07 NOTE — Telephone Encounter (Signed)
LMTCB

## 2018-07-07 NOTE — Telephone Encounter (Signed)
Pt wife, Agustin Cree on the [place], called to report that the pt has been having exertional chest pain with exercise over the past several weeks but has been worsening... she says it is burning in nature and it has been with exercise and relieved with rest... if he keeps pushing he would eventually feel like he will pass out... She says he also has SOB with it and it has also been worsening with exertion..they are requesting that he be seen soon if possible... she reports that he is still taking all of the meds on his med list including the Plavix and ASA...  I offered them an appt tomorrow with Chelsea Aus PA tomorrow and they were very appreciative.

## 2018-07-07 NOTE — Telephone Encounter (Signed)
Pt c/o Shortness Of Breath: STAT if SOB developed within the last 24 hours or pt is noticeably SOB on the phone  1. Are you currently SOB (can you hear that pt is SOB on the phone)? No   2. How long have you been experiencing SOB? When exercising  some.  3. Are you SOB when sitting or when up moving around? No  4. Are you currently experiencing any other symptoms? Some burning in the chest.

## 2018-07-07 NOTE — Telephone Encounter (Signed)
Pt last seen 2018 with Dr. Eldridge Dace and was advised after echo results to f/u PRN and call the office if needed. Per Suzan Nailer RN TL Triage ok to route to triage for further evaluation.

## 2018-07-08 ENCOUNTER — Encounter (INDEPENDENT_AMBULATORY_CARE_PROVIDER_SITE_OTHER): Payer: Self-pay

## 2018-07-08 ENCOUNTER — Encounter: Payer: Self-pay | Admitting: Physician Assistant

## 2018-07-08 ENCOUNTER — Ambulatory Visit: Payer: Medicare Other | Admitting: Physician Assistant

## 2018-07-08 VITALS — BP 150/76 | HR 70 | Ht 69.5 in | Wt 170.2 lb

## 2018-07-08 DIAGNOSIS — E782 Mixed hyperlipidemia: Secondary | ICD-10-CM

## 2018-07-08 DIAGNOSIS — R079 Chest pain, unspecified: Secondary | ICD-10-CM

## 2018-07-08 DIAGNOSIS — R0609 Other forms of dyspnea: Secondary | ICD-10-CM

## 2018-07-08 DIAGNOSIS — I25118 Atherosclerotic heart disease of native coronary artery with other forms of angina pectoris: Secondary | ICD-10-CM

## 2018-07-08 DIAGNOSIS — I359 Nonrheumatic aortic valve disorder, unspecified: Secondary | ICD-10-CM

## 2018-07-08 LAB — CBC
Hematocrit: 42.5 % (ref 37.5–51.0)
Hemoglobin: 14.9 g/dL (ref 13.0–17.7)
MCH: 30.5 pg (ref 26.6–33.0)
MCHC: 35.1 g/dL (ref 31.5–35.7)
MCV: 87 fL (ref 79–97)
PLATELETS: 280 10*3/uL (ref 150–450)
RBC: 4.88 x10E6/uL (ref 4.14–5.80)
RDW: 12.7 % (ref 11.6–15.4)
WBC: 7 10*3/uL (ref 3.4–10.8)

## 2018-07-08 LAB — BASIC METABOLIC PANEL
BUN/Creatinine Ratio: 13 (ref 10–24)
BUN: 15 mg/dL (ref 8–27)
CO2: 23 mmol/L (ref 20–29)
Calcium: 9.7 mg/dL (ref 8.6–10.2)
Chloride: 100 mmol/L (ref 96–106)
Creatinine, Ser: 1.13 mg/dL (ref 0.76–1.27)
GFR calc Af Amer: 77 mL/min/{1.73_m2} (ref >59)
GFR calc non Af Amer: 67 mL/min/{1.73_m2} (ref >59)
Glucose: 103 mg/dL — ABNORMAL HIGH (ref 65–99)
Potassium: 4.5 mmol/L (ref 3.5–5.2)
Sodium: 137 mmol/L (ref 134–144)

## 2018-07-08 MED ORDER — CARVEDILOL 3.125 MG PO TABS: 3.1250 mg | ORAL_TABLET | Freq: Two times a day (BID) | ORAL | 3 refills | Status: DC

## 2018-07-08 MED ORDER — CLOPIDOGREL BISULFATE 75 MG PO TABS: 75.0000 mg | ORAL_TABLET | Freq: Every day | ORAL | 3 refills | Status: DC

## 2018-07-08 MED ORDER — LISINOPRIL 20 MG PO TABS: 20.0000 mg | ORAL_TABLET | Freq: Every day | ORAL | 3 refills | Status: DC

## 2018-07-08 MED ORDER — NITROGLYCERIN 0.4 MG SL SUBL: 0.4000 mg | SUBLINGUAL_TABLET | SUBLINGUAL | 3 refills | Status: AC | PRN

## 2018-07-08 MED ORDER — ATORVASTATIN CALCIUM 40 MG PO TABS: ORAL_TABLET | ORAL | 3 refills | Status: DC

## 2018-07-08 NOTE — H&P (View-Only) (Signed)
Cardiology Office Note    Date:  07/08/2018   ID:  Joseph Morse, DOB 04/19/1951, MRN [id number]  PCP:  [name], MD  Cardiologist:  Dr. Eldridge Dace   Chief Complaint: Exertional chest pain   History of Present Illness:   Joseph Morse is a 68 y.o. male with hx of DES s/p RCA stenting (residual circumflex treated medically), bicuspid aortic valve with moderate AS, HTN and HLD presents for chest pain.   Last echo 03/2017: LVEF of 60-65%, grade 1 DD, bicuspid aortic valve, moderate AS with mean gradient 26mm hg, dilated aortic root to 4.0 cm.   Here today for chest pain evaluation.  For the past few months patient has exertional lower sternal chest pressure described as burning sensation and shortness of breath.  Relieves with rest.  He used to walk 5 to 10 miles without any issue now he has to stop multiple times even walking 1 to 2 months.  He also has dyspnea and chest pressure walking stair and of health.  He denies orthopnea, PND, syncope, lower extremity edema or melena.  He thinks if he had a bad pain and shortness of breath he might pass out.  But never actually lost consciousness.  He was only taking Plavix as recommended by Dr. Eldridge Dace however for the past 2 weeks he has started taking aspirin 81 mg.   Past Medical History:  Diagnosis Date  . Chest pain   . HTN (hypertension)   . Hyperlipidemia, mixed   . S/P right coronary artery (RCA) stent placement     No past surgical history on file.  Current Medications: Prior to Admission medications   Medication Sig Start Date End Date Taking? Authorizing Provider  aspirin 81 MG tablet Take 81 mg by mouth daily.    [provider]  atorvastatin (LIPITOR) 40 MG tablet TAKE 1 TABLET BY MOUTH DAILY. PLEASE MAKE OVERDUE APPT WITH DR. VARANASI BEFORE ANYMORE REFILLS. 06/28/18   Corky Crafts, MD  clopidogrel (PLAVIX) 75 MG tablet Take 1 tablet (75 mg total) by mouth daily. Please call to scheduled overdue appt  with Dr Eldridge Dace for future request (2nd attempt) 05/27/18   Corky Crafts, MD  lisinopril (PRINIVIL,ZESTRIL) 20 MG tablet Take 1 tablet (20 mg total) by mouth daily. Please make overdue appt with Dr. Eldridge Dace before anymore refills. 2nd attempt 06/23/18   Corky Crafts, MD  tadalafil (CIALIS) 10 MG tablet Take 1 tablet (10 mg total) by mouth as needed for erectile dysfunction. 05/01/16   Corky Crafts, MD    Allergies:   Patient has no known allergies.   Social History   Socioeconomic History  . Marital status: Married    Spouse name: Not on file  . Number of children: Not on file  . Years of education: Not on file  . Highest education level: Not on file  Occupational History  . Not on file  Social Needs  . Financial resource strain: Not on file  . Food insecurity:    Worry: Not on file    Inability: Not on file  . Transportation needs:    Medical: Not on file    Non-medical: Not on file  Tobacco Use  . Smoking status: Former Games developer  . Smokeless tobacco: Never Used  Substance and Sexual Activity  . Alcohol use: No    Frequency: Never  . Drug use: No  . Sexual activity: Not on file  Lifestyle  . Physical activity:  Days per week: Not on file    Minutes per session: Not on file  . Stress: Not on file  Relationships  . Social connections:    Talks on phone: Not on file    Gets together: Not on file    Attends religious service: Not on file    Active member of club or organization: Not on file    Attends meetings of clubs or organizations: Not on file    Relationship status: Not on file  Other Topics Concern  . Not on file  Social History Narrative  . Not on file     Family History:  The patient's family history includes CVA in his mother; Cancer in his maternal grandfather; Heart attack in his father; Hyperlipidemia in his sister.   ROS:   Please see the history of present illness.    ROS All other systems reviewed and are  negative.   PHYSICAL EXAM:   VS:  BP (!) 150/76   Pulse 70   Ht 5' 9.5" (1.765 m)   Wt 170 lb 3.2 oz (77.2 kg)   SpO2 96%   BMI 24.77 kg/m    GEN: Well nourished, well developed, in no acute distress  HEENT: normal  Neck: no JVD, carotid bruits, or masses Cardiac: RRR; no murmurs, rubs, or gallops,no edema  Respiratory:  clear to auscultation bilaterally, normal work of breathing GI: soft, nontender, nondistended, + BS MS: no deformity or atrophy  Skin: warm and dry, no rash Neuro:  Alert and Oriented x 3, Strength and sensation are intact Psych: euthymic mood, full affect  Wt Readings from Last 3 Encounters:  07/08/18 170 lb 3.2 oz (77.2 kg)  04/14/17 171 lb 9.6 oz (77.8 kg)  04/15/16 173 lb (78.5 kg)      Studies/Labs Reviewed:   EKG:  EKG is ordered today.  The ekg ordered today demonstrates sinus rhythm at rate of 70 bpm, LVH and left bundle branch block (chronic)  Recent Labs: No results found for requested labs within last 8760 hours.   Lipid Panel    Component Value Date/Time   CHOL 103 04/24/2016 0911   TRIG 46 04/24/2016 0911   HDL 41 04/24/2016 0911   CHOLHDL 2.5 04/24/2016 0911   VLDL 9 04/24/2016 0911   LDLCALC 53 04/24/2016 0911    Additional studies/ records that were reviewed today include:   Echocardiogram: 03/2017 Study Conclusions  - Left ventricle: The cavity size was normal. There was mild   concentric hypertrophy. Systolic function was normal. The   estimated ejection fraction was in the range of 60% to 65%. Wall   motion was normal; there were no regional wall motion   abnormalities. Doppler parameters are consistent with abnormal   left ventricular relaxation (grade 1 diastolic dysfunction). The   E&'e&' ratio is >15, suggesting elevated LV filling pressure. - Aortic valve: Calcified with restricted leaflet motion - probably   bicuspid or functionally bicuspid. Moderate stenosis. There was   mild regurgitation. Mean gradient (S): 22  mm Hg. Peak gradient   (S): 34 mm Hg. AVA around 1.2 cm2. - Aorta: Dilated aortic root. Aortic root dimension: 40 mm (ED). - Mitral valve: Mildly thickened leaflets . There was trivial   regurgitation. - Inferior vena cava: The vessel was normal in size. The   respirophasic diameter changes were in the normal range (= 50%),   consistent with normal central venous pressure.  Impressions:  - LVEF 60-65%, mild LVH, normal wall motion, grade  1 DD elevated LV   filling pressure, biscuspid valve with moderate calcific aortic   valve stenosis, mild AI, AVA around 1.2 cm2 - mean gradient of 22   mmHg, dilated aortic root to 4.0 cm.    ASSESSMENT & PLAN:    1. Exertional angina -Patient has few months history of progressive worsening exertional lower sternal chest pressure which described as burning and shortness of breath.  This relieves with rest.  He used to walk multiple miles now barely able to walk 1 or 2.  He also has exertional pain and dyspnea by walking stairs and heel.  His symptoms concerning for angina or worsening of moderate aortic stenosis.  We will proceed with left and right cath for further evaluation. The patient understands that risks include but are not limited to stroke (1 in 1000), death (1 in 1000), kidney failure [usually temporary] (1 in 500), bleeding (1 in 200), allergic reaction [possibly serious] (1 in 200), and agrees to proceed.   2. Moderate AS -He has dyspnea on exertion.  No CHF symptoms.  Will get right heart cath instead echocardiogram.  Will review with Dr. Eldridge DaceVaranasi.  3.  Hyperlipidemia -No lipids checked since 2017.  Will check during follow-up.  Continue Lipitor 40 mg.  4.  Hypertension -Elevated.  Not checking at home.  Continue lisinopril 20 mg daily.  Add Coreg.   Medication Adjustments/Labs and Tests Ordered: Current medicines are reviewed at length with the patient today.  Concerns regarding medicines are outlined above.  Medication changes,  Labs and Tests ordered today are listed in the Patient Instructions below. Patient Instructions  Medication Instructions:  Your physician recommends that you continue on your current medications as directed. Please refer to the Current Medication list given to you today.  If you need a refill on your cardiac medications before your next appointment, please call your pharmacy.   Lab work: TODAY:  BMET & CBC  If you have labs (blood work) drawn today and your tests are completely normal, you will receive your results only by: Marland Kitchen. MyChart Message (if you have MyChart) OR . A paper copy in the mail If you have any lab test that is abnormal or we need to change your treatment, we will call you to review the results.  Testing/Procedures: Your physician has requested that you have a cardiac catheterization. Cardiac catheterization is used to diagnose and/or treat various heart conditions. Doctors may recommend this procedure for a number of different reasons. The most common reason is to evaluate chest pain. Chest pain can be a symptom of coronary artery disease (CAD), and cardiac catheterization can show whether plaque is narrowing or blocking your heart's arteries. This procedure is also used to evaluate the valves, as well as measure the blood flow and oxygen levels in different parts of your heart. For further information please visit [email or web address]. Please follow instruction sheet, BELOW:     [facility] [address] [facility] [address], [address] [place] [address] Dept: [phone] Loc: [phone]  Felicity PellegriniDaniel S Vancleve  07/08/2018  You are scheduled for a Cardiac Catheterization on Tuesday, February 18 with Dr. Lance MussJayadeep Varanasi.  1. [facility] : [address] [place], [place] [address] at 5:30 AM (This time is two hours before your procedure to ensure  your preparation). Free valet parking service is available.   Special note: Every effort is made to  have your procedure done on time. Please understand that emergencies sometimes delay scheduled procedures.  2. Diet: Do not eat solid foods after midnight.  The patient may have clear liquids until 5am upon the day of the procedure.  3. Labs: You will need to have blood drawn on TODAY  4. Medication instructions in preparation for your procedure:  You may take your regular morning medications on the morning of your procedure   Contrast Allergy: No  On the morning of your procedure, take your Aspirin and Plavix and any morning medicines NOT listed above.  You may use sips of water.  5. Plan for one night stay--bring personal belongings. 6. Bring a current list of your medications and current insurance cards. 7. You MUST have a responsible person to drive you home. 8. Someone MUST be with you the first 24 hours after you arrive home or your discharge will be delayed. 9. Please wear clothes that are easy to get on and off and wear slip-on shoes.  Thank you for allowing [place] to care for you!   -- [facility]     Follow-Up: Your physician recommends that you schedule a follow-up appointment in: 07/29/2018 ARRIVE AT 9:45 TO SEE VIN Jetty Berland, PA-C   Any Other Special Instructions Will Be Listed Below (If Applicable).    Coronary Angiogram With Stent Coronary angiogram with stent placement is a procedure to widen or open a narrow blood vessel of the heart (coronary artery). Arteries may become blocked by cholesterol buildup (plaques) in the lining of the wall. When a coronary artery becomes partially blocked, blood flow to that area decreases. This may lead to chest pain or a heart attack (myocardial infarction). A stent is a small piece of metal that looks like mesh or a spring. Stent placement may be done as treatment for a heart attack or right after a coronary  angiogram in which a blocked artery is found. Let your health care provider know about:  Any allergies you have.  All medicines you are taking, including vitamins, herbs, eye drops, creams, and over-the-counter medicines.  Any problems you or family members have had with anesthetic medicines.  Any blood disorders you have.  Any surgeries you have had.  Any medical conditions you have.  Whether you are pregnant or may be pregnant. What are the risks? Generally, this is a safe procedure. However, problems may occur, including:  Damage to the heart or its blood vessels.  A return of blockage.  Bleeding, infection, or bruising at the insertion site.  A collection of blood under the skin (hematoma) at the insertion site.  A blood clot in another part of the body.  Kidney injury.  Allergic reaction to the dye or contrast that is used.  Bleeding into the abdomen (retroperitoneal bleeding). What happens before the procedure? Staying hydrated Follow instructions from your health care provider about hydration, which may include:  Up to 2 hours before the procedure - you may continue to drink clear liquids, such as water, clear fruit juice, black coffee, and plain tea.  Eating and drinking restrictions Follow instructions from your health care provider about eating and drinking, which may include:  8 hours before the procedure - stop eating heavy meals or foods such as meat, fried foods, or fatty foods.  6 hours before the procedure - stop eating light meals or foods, such as toast or cereal.  2 hours before the procedure - stop drinking clear liquids. Ask your health  care provider about:  Changing or stopping your regular medicines. This is especially important if you are taking diabetes medicines or blood thinners.  Taking medicines such as ibuprofen. These medicines can thin your blood. Do not take these medicines before your procedure if your health care provider  instructs you not to. Generally, aspirin is recommended before a procedure of passing a small, thin tube (catheter) through a blood vessel and into the heart (cardiac catheterization). What happens during the procedure?   An IV tube will be inserted into one of your veins.  You will be given one or more of the following: ? A medicine to help you relax (sedative). ? A medicine to numb the area where the catheter will be inserted into an artery (local anesthetic).  To reduce your risk of infection: ? Your health care team will wash or sanitize their hands. ? Your skin will be washed with soap. ? Hair may be removed from the area where the catheter will be inserted.  Using a guide wire, the catheter will be inserted into an artery. The location may be in your groin, in your wrist, or in the fold of your arm (near your elbow).  A type of X-ray (fluoroscopy) will be used to help guide the catheter to the opening of the arteries in the heart.  A dye will be injected into the catheter, and X-rays will be taken. The dye will help to show where any narrowing or blockages are located in the arteries.  A tiny wire will be guided to the blocked spot, and a balloon will be inflated to make the artery wider.  The stent will be expanded and will crush the plaques into the wall of the vessel. The stent will hold the area open and improve the blood flow. Most stents have a drug coating to reduce the risk of the stent narrowing over time.  The artery may be made wider using a drill, laser, or other tools to remove plaques.  When the blood flow is better, the catheter will be removed. The lining of the artery will grow over the stent, which stays where it was placed. This procedure may vary among health care providers and hospitals. What happens after the procedure?  If the procedure is done through the leg, you will be kept in bed lying flat for about 6 hours. You will be instructed to not bend and not  cross your legs.  The insertion site will be checked frequently.  The pulse in your foot or wrist will be checked frequently.  You may have additional blood tests, X-rays, and a test that records the electrical activity of your heart (electrocardiogram, or ECG). This information is not intended to replace advice given to you by your health care provider. Make sure you discuss any questions you have with your health care provider. Document Released: 11/16/2002 Document Revised: 08/21/2017 Document Reviewed: 12/16/2015 Elsevier Interactive Patient Education  [address]2019 [employer]     Vonzella NippleSigned, Romney Compean Ten BroeckBhagat, [place]  07/08/2018 11:37 AM    [facility] [address], [place], [place]  [address] Phone: [phone]; Fax: [phone]

## 2018-07-08 NOTE — Patient Instructions (Addendum)
Medication Instructions:  Your physician has recommended you make the following change in your medication:  1.  START Carvedilol 3.125 mg taking 1 tablet twice a day 2.  START Nitorglycerin 0.4 mg s/l tablet USED AS DIRECTED ON THE BOTTLE   If you need a refill on your cardiac medications before your next appointment, please call your pharmacy.   Lab work: TODAY:  BMET & CBC  If you have labs (blood work) drawn today and your tests are completely normal, you will receive your results only by: Marland Kitchen MyChart Message (if you have MyChart) OR . A paper copy in the mail If you have any lab test that is abnormal or we need to change your treatment, we will call you to review the results.  Testing/Procedures: Your physician has requested that you have a cardiac catheterization. Cardiac catheterization is used to diagnose and/or treat various heart conditions. Doctors may recommend this procedure for a number of different reasons. The most common reason is to evaluate chest pain. Chest pain can be a symptom of coronary artery disease (CAD), and cardiac catheterization can show whether plaque is narrowing or blocking your heart's arteries. This procedure is also used to evaluate the valves, as well as measure the blood flow and oxygen levels in different parts of your heart. For further information please visit [email or web address] Please follow instruction sheet, BELOW:     [facility] CARDIOVASCULAR DIVISION [facility] ST OFFICE [address] [place] [address] Dept: [phone] Loc: [phone]  Joseph Morse  07/08/2018  You are scheduled for a Cardiac Catheterization on Tuesday, February 18 with Dr. Lance Muss.  1. Please arrive at the [facility] (Main Entrance A) at [facility]: [address] [place], [place] [address] at 5:30 AM (This time is two hours before your procedure to ensure your preparation). Free valet  parking service is available.   Special note: Every effort is made to have your procedure done on time. Please understand that emergencies sometimes delay scheduled procedures.  2. Diet: Do not eat solid foods after midnight.  The patient may have clear liquids until 5am upon the day of the procedure.  3. Labs: You will need to have blood drawn on TODAY  4. Medication instructions in preparation for your procedure:  You may take your regular morning medications on the morning of your procedure   Contrast Allergy: No  On the morning of your procedure, take your Aspirin and Plavix and any morning medicines NOT listed above.  You may use sips of water.  5. Plan for one night stay--bring personal belongings. 6. Bring a current list of your medications and current insurance cards. 7. You MUST have a responsible person to drive you home. 8. Someone MUST be with you the first 24 hours after you arrive home or your discharge will be delayed. 9. Please wear clothes that are easy to get on and off and wear slip-on shoes.  Thank you for allowing [place] to care for you!   --  Invasive Cardiovascular services     Follow-Up: Your physician recommends that you schedule a follow-up appointment in: 07/29/2018 ARRIVE AT 9:45 TO SEE VIN BHAGAT, PA-C   Any Other Special Instructions Will Be Listed Below (If Applicable).    Coronary Angiogram With Stent Coronary angiogram with stent placement is a procedure to widen or open a narrow blood vessel of the heart (coronary artery). Arteries may become blocked by cholesterol  buildup (plaques) in the lining of the wall. When a coronary artery becomes partially blocked, blood flow to that area decreases. This may lead to chest pain or a heart attack (myocardial infarction). A stent is a small piece of metal that looks like mesh or a spring. Stent placement may be done as treatment for a heart attack or right after a coronary angiogram in which a blocked  artery is found. Let your health care provider know about:  Any allergies you have.  All medicines you are taking, including vitamins, herbs, eye drops, creams, and over-the-counter medicines.  Any problems you or family members have had with anesthetic medicines.  Any blood disorders you have.  Any surgeries you have had.  Any medical conditions you have.  Whether you are pregnant or may be pregnant. What are the risks? Generally, this is a safe procedure. However, problems may occur, including:  Damage to the heart or its blood vessels.  A return of blockage.  Bleeding, infection, or bruising at the insertion site.  A collection of blood under the skin (hematoma) at the insertion site.  A blood clot in another part of the body.  Kidney injury.  Allergic reaction to the dye or contrast that is used.  Bleeding into the abdomen (retroperitoneal bleeding). What happens before the procedure? Staying hydrated Follow instructions from your health care provider about hydration, which may include:  Up to 2 hours before the procedure - you may continue to drink clear liquids, such as water, clear fruit juice, black coffee, and plain tea.  Eating and drinking restrictions Follow instructions from your health care provider about eating and drinking, which may include:  8 hours before the procedure - stop eating heavy meals or foods such as meat, fried foods, or fatty foods.  6 hours before the procedure - stop eating light meals or foods, such as toast or cereal.  2 hours before the procedure - stop drinking clear liquids. Ask your health care provider about:  Changing or stopping your regular medicines. This is especially important if you are taking diabetes medicines or blood thinners.  Taking medicines such as ibuprofen. These medicines can thin your blood. Do not take these medicines before your procedure if your health care provider instructs you not to. Generally,  aspirin is recommended before a procedure of passing a small, thin tube (catheter) through a blood vessel and into the heart (cardiac catheterization). What happens during the procedure?   An IV tube will be inserted into one of your veins.  You will be given one or more of the following: ? A medicine to help you relax (sedative). ? A medicine to numb the area where the catheter will be inserted into an artery (local anesthetic).  To reduce your risk of infection: ? Your health care team will wash or sanitize their hands. ? Your skin will be washed with soap. ? Hair may be removed from the area where the catheter will be inserted.  Using a guide wire, the catheter will be inserted into an artery. The location may be in your groin, in your wrist, or in the fold of your arm (near your elbow).  A type of X-ray (fluoroscopy) will be used to help guide the catheter to the opening of the arteries in the heart.  A dye will be injected into the catheter, and X-rays will be taken. The dye will help to show where any narrowing or blockages are located in the arteries.  A tiny  wire will be guided to the blocked spot, and a balloon will be inflated to make the artery wider.  The stent will be expanded and will crush the plaques into the wall of the vessel. The stent will hold the area open and improve the blood flow. Most stents have a drug coating to reduce the risk of the stent narrowing over time.  The artery may be made wider using a drill, laser, or other tools to remove plaques.  When the blood flow is better, the catheter will be removed. The lining of the artery will grow over the stent, which stays where it was placed. This procedure may vary among health care providers and hospitals. What happens after the procedure?  If the procedure is done through the leg, you will be kept in bed lying flat for about 6 hours. You will be instructed to not bend and not cross your legs.  The insertion  site will be checked frequently.  The pulse in your foot or wrist will be checked frequently.  You may have additional blood tests, X-rays, and a test that records the electrical activity of your heart (electrocardiogram, or ECG). This information is not intended to replace advice given to you by your health care provider. Make sure you discuss any questions you have with your health care provider. Document Released: 11/16/2002 Document Revised: 08/21/2017 Document Reviewed: 12/16/2015 [employer] Interactive Patient Education  2019 [employer]

## 2018-07-08 NOTE — Progress Notes (Signed)
 Cardiology Office Note    Date:  07/08/2018   ID:  Joseph Morse, DOB 07/26/1950, MRN [id number]  PCP:  Harris, William, MD  Cardiologist:  Dr. Varanasi   Chief Complaint: Exertional chest pain   History of Present Illness:   Joseph Morse is a 67 y.o. male with hx of DES s/p RCA stenting (residual circumflex treated medically), bicuspid aortic valve with moderate AS, HTN and HLD presents for chest pain.   Last echo 03/2017: LVEF of 60-65%, grade 1 DD, bicuspid aortic valve, moderate AS with mean gradient 22mm hg, dilated aortic root to 4.0 cm.   Here today for chest pain evaluation.  For the past few months patient has exertional lower sternal chest pressure described as burning sensation and shortness of breath.  Relieves with rest.  He used to walk 5 to 10 miles without any issue now he has to stop multiple times even walking 1 to 2 months.  He also has dyspnea and chest pressure walking stair and of health.  He denies orthopnea, PND, syncope, lower extremity edema or melena.  He thinks if he had a bad pain and shortness of breath he might pass out.  But never actually lost consciousness.  He was only taking Plavix as recommended by Dr. Varanasi however for the past 2 weeks he has started taking aspirin 81 mg.   Past Medical History:  Diagnosis Date  . Chest pain   . HTN (hypertension)   . Hyperlipidemia, mixed   . S/P right coronary artery (RCA) stent placement     No past surgical history on file.  Current Medications: Prior to Admission medications   Medication Sig Start Date End Date Taking? Authorizing Provider  aspirin 81 MG tablet Take 81 mg by mouth daily.    [provider]  atorvastatin (LIPITOR) 40 MG tablet TAKE 1 TABLET BY MOUTH DAILY. PLEASE MAKE OVERDUE APPT WITH DR. VARANASI BEFORE ANYMORE REFILLS. 06/28/18   Varanasi, Jayadeep S, MD  clopidogrel (PLAVIX) 75 MG tablet Take 1 tablet (75 mg total) by mouth daily. Please call to scheduled overdue appt  with Dr Varanasi for future request (2nd attempt) 05/27/18   Varanasi, Jayadeep S, MD  lisinopril (PRINIVIL,ZESTRIL) 20 MG tablet Take 1 tablet (20 mg total) by mouth daily. Please make overdue appt with Dr. Varanasi before anymore refills. 2nd attempt 06/23/18   Varanasi, Jayadeep S, MD  tadalafil (CIALIS) 10 MG tablet Take 1 tablet (10 mg total) by mouth as needed for erectile dysfunction. 05/01/16   Varanasi, Jayadeep S, MD    Allergies:   Patient has no known allergies.   Social History   Socioeconomic History  . Marital status: Married    Spouse name: Not on file  . Number of children: Not on file  . Years of education: Not on file  . Highest education level: Not on file  Occupational History  . Not on file  Social Needs  . Financial resource strain: Not on file  . Food insecurity:    Worry: Not on file    Inability: Not on file  . Transportation needs:    Medical: Not on file    Non-medical: Not on file  Tobacco Use  . Smoking status: Former Smoker  . Smokeless tobacco: Never Used  Substance and Sexual Activity  . Alcohol use: No    Frequency: Never  . Drug use: No  . Sexual activity: Not on file  Lifestyle  . Physical activity:      Days per week: Not on file    Minutes per session: Not on file  . Stress: Not on file  Relationships  . Social connections:    Talks on phone: Not on file    Gets together: Not on file    Attends religious service: Not on file    Active member of club or organization: Not on file    Attends meetings of clubs or organizations: Not on file    Relationship status: Not on file  Other Topics Concern  . Not on file  Social History Narrative  . Not on file     Family History:  The patient's family history includes CVA in his mother; Cancer in his maternal grandfather; Heart attack in his father; Hyperlipidemia in his sister.   ROS:   Please see the history of present illness.    ROS All other systems reviewed and are  negative.   PHYSICAL EXAM:   VS:  BP (!) 150/76   Pulse 70   Ht 5' 9.5" (1.765 m)   Wt 170 lb 3.2 oz (77.2 kg)   SpO2 96%   BMI 24.77 kg/m    GEN: Well nourished, well developed, in no acute distress  HEENT: normal  Neck: no JVD, carotid bruits, or masses Cardiac: RRR; no murmurs, rubs, or gallops,no edema  Respiratory:  clear to auscultation bilaterally, normal work of breathing GI: soft, nontender, nondistended, + BS MS: no deformity or atrophy  Skin: warm and dry, no rash Neuro:  Alert and Oriented x 3, Strength and sensation are intact Psych: euthymic mood, full affect  Wt Readings from Last 3 Encounters:  07/08/18 170 lb 3.2 oz (77.2 kg)  04/14/17 171 lb 9.6 oz (77.8 kg)  04/15/16 173 lb (78.5 kg)      Studies/Labs Reviewed:   EKG:  EKG is ordered today.  The ekg ordered today demonstrates sinus rhythm at rate of 70 bpm, LVH and left bundle branch block (chronic)  Recent Labs: No results found for requested labs within last 8760 hours.   Lipid Panel    Component Value Date/Time   CHOL 103 04/24/2016 0911   TRIG 46 04/24/2016 0911   HDL 41 04/24/2016 0911   CHOLHDL 2.5 04/24/2016 0911   VLDL 9 04/24/2016 0911   LDLCALC 53 04/24/2016 0911    Additional studies/ records that were reviewed today include:   Echocardiogram: 03/2017 Study Conclusions  - Left ventricle: The cavity size was normal. There was mild   concentric hypertrophy. Systolic function was normal. The   estimated ejection fraction was in the range of 60% to 65%. Wall   motion was normal; there were no regional wall motion   abnormalities. Doppler parameters are consistent with abnormal   left ventricular relaxation (grade 1 diastolic dysfunction). The   E&'e&' ratio is >15, suggesting elevated LV filling pressure. - Aortic valve: Calcified with restricted leaflet motion - probably   bicuspid or functionally bicuspid. Moderate stenosis. There was   mild regurgitation. Mean gradient (S): 22  mm Hg. Peak gradient   (S): 34 mm Hg. AVA around 1.2 cm2. - Aorta: Dilated aortic root. Aortic root dimension: 40 mm (ED). - Mitral valve: Mildly thickened leaflets . There was trivial   regurgitation. - Inferior vena cava: The vessel was normal in size. The   respirophasic diameter changes were in the normal range (= 50%),   consistent with normal central venous pressure.  Impressions:  - LVEF 60-65%, mild LVH, normal wall motion, grade   1 DD elevated LV   filling pressure, biscuspid valve with moderate calcific aortic   valve stenosis, mild AI, AVA around 1.2 cm2 - mean gradient of 22   mmHg, dilated aortic root to 4.0 cm.    ASSESSMENT & PLAN:    1. Exertional angina -Patient has few months history of progressive worsening exertional lower sternal chest pressure which described as burning and shortness of breath.  This relieves with rest.  He used to walk multiple miles now barely able to walk 1 or 2.  He also has exertional pain and dyspnea by walking stairs and heel.  His symptoms concerning for angina or worsening of moderate aortic stenosis.  We will proceed with left and right cath for further evaluation. The patient understands that risks include but are not limited to stroke (1 in 1000), death (1 in 1000), kidney failure [usually temporary] (1 in 500), bleeding (1 in 200), allergic reaction [possibly serious] (1 in 200), and agrees to proceed.   2. Moderate AS -He has dyspnea on exertion.  No CHF symptoms.  Will get right heart cath instead echocardiogram.  Will review with Dr. Varanasi.  3.  Hyperlipidemia -No lipids checked since 2017.  Will check during follow-up.  Continue Lipitor 40 mg.  4.  Hypertension -Elevated.  Not checking at home.  Continue lisinopril 20 mg daily.  Add Coreg.   Medication Adjustments/Labs and Tests Ordered: Current medicines are reviewed at length with the patient today.  Concerns regarding medicines are outlined above.  Medication changes,  Labs and Tests ordered today are listed in the Patient Instructions below. Patient Instructions  Medication Instructions:  Your physician recommends that you continue on your current medications as directed. Please refer to the Current Medication list given to you today.  If you need a refill on your cardiac medications before your next appointment, please call your pharmacy.   Lab work: TODAY:  BMET & CBC  If you have labs (blood work) drawn today and your tests are completely normal, you will receive your results only by: . MyChart Message (if you have MyChart) OR . A paper copy in the mail If you have any lab test that is abnormal or we need to change your treatment, we will call you to review the results.  Testing/Procedures: Your physician has requested that you have a cardiac catheterization. Cardiac catheterization is used to diagnose and/or treat various heart conditions. Doctors may recommend this procedure for a number of different reasons. The most common reason is to evaluate chest pain. Chest pain can be a symptom of coronary artery disease (CAD), and cardiac catheterization can show whether plaque is narrowing or blocking your heart's arteries. This procedure is also used to evaluate the valves, as well as measure the blood flow and oxygen levels in different parts of your heart. For further information please visit www.cardiosmart.org. Please follow instruction sheet, BELOW:     [facility] [address], [address] [place] [address] Dept: [phone] Loc: [phone]  Joseph Morse  07/08/2018  You are scheduled for a Cardiac Catheterization on Tuesday, February 18 with Dr. Jayadeep Varanasi.  1. Please arrive at the [facility] Tower (Main Entrance A) at [facility]: [address] [place], [place] [address] at 5:30 AM (This time is two hours before your procedure to ensure  your preparation). Free valet parking service is available.   Special note: Every effort is made to   have your procedure done on time. Please understand that emergencies sometimes delay scheduled procedures.  2. Diet: Do not eat solid foods after midnight.  The patient may have clear liquids until 5am upon the day of the procedure.  3. Labs: You will need to have blood drawn on TODAY  4. Medication instructions in preparation for your procedure:  You may take your regular morning medications on the morning of your procedure   Contrast Allergy: No  On the morning of your procedure, take your Aspirin and Plavix and any morning medicines NOT listed above.  You may use sips of water.  5. Plan for one night stay--bring personal belongings. 6. Bring a current list of your medications and current insurance cards. 7. You MUST have a responsible person to drive you home. 8. Someone MUST be with you the first 24 hours after you arrive home or your discharge will be delayed. 9. Please wear clothes that are easy to get on and off and wear slip-on shoes.  Thank you for allowing us to care for you!   -- [facility]     Follow-Up: Your physician recommends that you schedule a follow-up appointment in: 07/29/2018 ARRIVE AT 9:45 TO SEE VIN Brasen Bundren, PA-C   Any Other Special Instructions Will Be Listed Below (If Applicable).    Coronary Angiogram With Stent Coronary angiogram with stent placement is a procedure to widen or open a narrow blood vessel of the heart (coronary artery). Arteries may become blocked by cholesterol buildup (plaques) in the lining of the wall. When a coronary artery becomes partially blocked, blood flow to that area decreases. This may lead to chest pain or a heart attack (myocardial infarction). A stent is a small piece of metal that looks like mesh or a spring. Stent placement may be done as treatment for a heart attack or right after a coronary  angiogram in which a blocked artery is found. Let your health care provider know about:  Any allergies you have.  All medicines you are taking, including vitamins, herbs, eye drops, creams, and over-the-counter medicines.  Any problems you or family members have had with anesthetic medicines.  Any blood disorders you have.  Any surgeries you have had.  Any medical conditions you have.  Whether you are pregnant or may be pregnant. What are the risks? Generally, this is a safe procedure. However, problems may occur, including:  Damage to the heart or its blood vessels.  A return of blockage.  Bleeding, infection, or bruising at the insertion site.  A collection of blood under the skin (hematoma) at the insertion site.  A blood clot in another part of the body.  Kidney injury.  Allergic reaction to the dye or contrast that is used.  Bleeding into the abdomen (retroperitoneal bleeding). What happens before the procedure? Staying hydrated Follow instructions from your health care provider about hydration, which may include:  Up to 2 hours before the procedure - you may continue to drink clear liquids, such as water, clear fruit juice, black coffee, and plain tea.  Eating and drinking restrictions Follow instructions from your health care provider about eating and drinking, which may include:  8 hours before the procedure - stop eating heavy meals or foods such as meat, fried foods, or fatty foods.  6 hours before the procedure - stop eating light meals or foods, such as toast or cereal.  2 hours before the procedure - stop drinking clear liquids. Ask your health   care provider about:  Changing or stopping your regular medicines. This is especially important if you are taking diabetes medicines or blood thinners.  Taking medicines such as ibuprofen. These medicines can thin your blood. Do not take these medicines before your procedure if your health care provider  instructs you not to. Generally, aspirin is recommended before a procedure of passing a small, thin tube (catheter) through a blood vessel and into the heart (cardiac catheterization). What happens during the procedure?   An IV tube will be inserted into one of your veins.  You will be given one or more of the following: ? A medicine to help you relax (sedative). ? A medicine to numb the area where the catheter will be inserted into an artery (local anesthetic).  To reduce your risk of infection: ? Your health care team will wash or sanitize their hands. ? Your skin will be washed with soap. ? Hair may be removed from the area where the catheter will be inserted.  Using a guide wire, the catheter will be inserted into an artery. The location may be in your groin, in your wrist, or in the fold of your arm (near your elbow).  A type of X-ray (fluoroscopy) will be used to help guide the catheter to the opening of the arteries in the heart.  A dye will be injected into the catheter, and X-rays will be taken. The dye will help to show where any narrowing or blockages are located in the arteries.  A tiny wire will be guided to the blocked spot, and a balloon will be inflated to make the artery wider.  The stent will be expanded and will crush the plaques into the wall of the vessel. The stent will hold the area open and improve the blood flow. Most stents have a drug coating to reduce the risk of the stent narrowing over time.  The artery may be made wider using a drill, laser, or other tools to remove plaques.  When the blood flow is better, the catheter will be removed. The lining of the artery will grow over the stent, which stays where it was placed. This procedure may vary among health care providers and hospitals. What happens after the procedure?  If the procedure is done through the leg, you will be kept in bed lying flat for about 6 hours. You will be instructed to not bend and not  cross your legs.  The insertion site will be checked frequently.  The pulse in your foot or wrist will be checked frequently.  You may have additional blood tests, X-rays, and a test that records the electrical activity of your heart (electrocardiogram, or ECG). This information is not intended to replace advice given to you by your health care provider. Make sure you discuss any questions you have with your health care provider. Document Released: 11/16/2002 Document Revised: 08/21/2017 Document Reviewed: 12/16/2015 Elsevier Interactive Patient Education  2019 Elsevier Inc.     Signed, Anastasha Ortez, PA  07/08/2018 11:37 AM    [facility] [address], [place], [place]  [address] Phone: [phone]; Fax: [phone]  

## 2018-07-09 ENCOUNTER — Telehealth: Payer: Self-pay | Admitting: *Deleted

## 2018-07-09 DIAGNOSIS — I359 Nonrheumatic aortic valve disorder, unspecified: Secondary | ICD-10-CM

## 2018-07-09 DIAGNOSIS — R0609 Other forms of dyspnea: Secondary | ICD-10-CM

## 2018-07-09 NOTE — Telephone Encounter (Signed)
-----   Message from [place], [place] sent at 07/09/2018  2:20 PM EST ----- Normal Hgb, Electrolytes and kidney function.  Reviewed with Dr. Eldridge Dace who preferes to have echocardiogram done possibly Monday prior to cath.  Can we work on this?  If not possible, will proceed with cath on Tuesday.

## 2018-07-12 ENCOUNTER — Ambulatory Visit (HOSPITAL_COMMUNITY): Payer: Medicare Other | Attending: Cardiology

## 2018-07-12 ENCOUNTER — Telehealth: Payer: Self-pay | Admitting: *Deleted

## 2018-07-12 DIAGNOSIS — I359 Nonrheumatic aortic valve disorder, unspecified: Secondary | ICD-10-CM

## 2018-07-12 DIAGNOSIS — R0609 Other forms of dyspnea: Secondary | ICD-10-CM

## 2018-07-12 NOTE — Telephone Encounter (Signed)
Pt contacted pre-catheterization scheduled at [facility] for: Tuesday July 13, 2018 7:30 AM Verified arrival time and place: [facility] Main Entrance A at: 5:30 AM  No solid food after midnight prior to cath, clear liquids until 5 AM day of procedure. Contrast allergy: no Verified no diabetes medications.  AM meds can be  taken pre-cath with sip of water including: ASA 81 mg Clopidogrel 75 mg  Confirmed patient has responsible person to drive home post procedure and observe 24 hours after arriving home: yes

## 2018-07-13 ENCOUNTER — Encounter (HOSPITAL_COMMUNITY)
Admission: RE | Disposition: A | Discharge status: Home / Self Care | Payer: Self-pay | Source: Home / Self Care | Attending: Interventional Cardiology

## 2018-07-13 ENCOUNTER — Ambulatory Visit (HOSPITAL_COMMUNITY)
Admission: RE | Admit: 2018-07-13 | Discharge: 2018-07-13 | Disposition: A | Discharge status: Home / Self Care | Payer: Medicare Other | Attending: Interventional Cardiology | Admitting: Interventional Cardiology

## 2018-07-13 ENCOUNTER — Encounter (HOSPITAL_COMMUNITY): Payer: Self-pay | Admitting: Interventional Cardiology

## 2018-07-13 DIAGNOSIS — E782 Mixed hyperlipidemia: Secondary | ICD-10-CM | POA: Diagnosis not present

## 2018-07-13 DIAGNOSIS — I350 Nonrheumatic aortic (valve) stenosis: Secondary | ICD-10-CM

## 2018-07-13 DIAGNOSIS — Z87891 Personal history of nicotine dependence: Secondary | ICD-10-CM | POA: Diagnosis not present

## 2018-07-13 DIAGNOSIS — I25119 Atherosclerotic heart disease of native coronary artery with unspecified angina pectoris: Secondary | ICD-10-CM | POA: Insufficient documentation

## 2018-07-13 DIAGNOSIS — Z79899 Other long term (current) drug therapy: Secondary | ICD-10-CM | POA: Diagnosis not present

## 2018-07-13 DIAGNOSIS — Z7902 Long term (current) use of antithrombotics/antiplatelets: Secondary | ICD-10-CM | POA: Diagnosis not present

## 2018-07-13 DIAGNOSIS — Z8673 Personal history of transient ischemic attack (TIA), and cerebral infarction without residual deficits: Secondary | ICD-10-CM | POA: Diagnosis not present

## 2018-07-13 DIAGNOSIS — Z7982 Long term (current) use of aspirin: Secondary | ICD-10-CM | POA: Insufficient documentation

## 2018-07-13 DIAGNOSIS — I10 Essential (primary) hypertension: Secondary | ICD-10-CM | POA: Diagnosis not present

## 2018-07-13 DIAGNOSIS — R079 Chest pain, unspecified: Secondary | ICD-10-CM

## 2018-07-13 DIAGNOSIS — I209 Angina pectoris, unspecified: Secondary | ICD-10-CM

## 2018-07-13 HISTORY — PX: RIGHT HEART CATH AND CORONARY ANGIOGRAPHY: CATH118264

## 2018-07-13 LAB — POCT I-STAT 7, (LYTES, BLD GAS, ICA,H+H)
Acid-base deficit: 2 mmol/L (ref 0.0–2.0)
Bicarbonate: 23.6 mmol/L (ref 20.0–28.0)
Calcium, Ion: 1.25 mmol/L (ref 1.15–1.40)
HCT: 39 % (ref 39.0–52.0)
Hemoglobin: 13.3 g/dL (ref 13.0–17.0)
O2 SAT: 95 %
Potassium: 4.3 mmol/L (ref 3.5–5.1)
Sodium: 141 mmol/L (ref 135–145)
TCO2: 25 mmol/L (ref 22–32)
pCO2 arterial: 42.5 mmHg (ref 32.0–48.0)
pH, Arterial: 7.353 (ref 7.350–7.450)
pO2, Arterial: 78 mmHg — ABNORMAL LOW (ref 83.0–108.0)

## 2018-07-13 LAB — POCT I-STAT EG7
Acid-base deficit: 1 mmol/L (ref 0.0–2.0)
Acid-base deficit: 2 mmol/L (ref 0.0–2.0)
Bicarbonate: 24.1 mmol/L (ref 20.0–28.0)
Bicarbonate: 24.5 mmol/L (ref 20.0–28.0)
Calcium, Ion: 1.16 mmol/L (ref 1.15–1.40)
Calcium, Ion: 1.26 mmol/L (ref 1.15–1.40)
HCT: 39 % (ref 39.0–52.0)
HEMATOCRIT: 37 % — AB (ref 39.0–52.0)
Hemoglobin: 12.6 g/dL — ABNORMAL LOW (ref 13.0–17.0)
Hemoglobin: 13.3 g/dL (ref 13.0–17.0)
O2 Saturation: 75 %
O2 Saturation: 76 %
PO2 VEN: 42 mmHg (ref 32.0–45.0)
Potassium: 4 mmol/L (ref 3.5–5.1)
Potassium: 4.3 mmol/L (ref 3.5–5.1)
Sodium: 141 mmol/L (ref 135–145)
Sodium: 142 mmol/L (ref 135–145)
TCO2: 25 mmol/L (ref 22–32)
TCO2: 26 mmol/L (ref 22–32)
pCO2, Ven: 44 mmHg (ref 44.0–60.0)
pCO2, Ven: 45.5 mmHg (ref 44.0–60.0)
pH, Ven: 7.332 (ref 7.250–7.430)
pH, Ven: 7.354 (ref 7.250–7.430)
pO2, Ven: 44 mmHg (ref 32.0–45.0)

## 2018-07-13 SURGERY — RIGHT HEART CATH AND CORONARY ANGIOGRAPHY
Anesthesia: LOCAL

## 2018-07-13 MED ORDER — HEPARIN SODIUM (PORCINE) 1000 UNIT/ML IJ SOLN
INTRAMUSCULAR | Status: AC
  Filled 2018-07-13: qty 1

## 2018-07-13 MED ORDER — HEPARIN (PORCINE) IN NACL 1000-0.9 UT/500ML-% IV SOLN
INTRAVENOUS | Status: AC
  Filled 2018-07-13: qty 500

## 2018-07-13 MED ORDER — LIDOCAINE HCL (PF) 1 % IJ SOLN
INTRAMUSCULAR | Status: AC
  Filled 2018-07-13: qty 30

## 2018-07-13 MED ORDER — FENTANYL CITRATE (PF) 100 MCG/2ML IJ SOLN
INTRAMUSCULAR | Status: DC | PRN
  Administered 2018-07-13 (×2): 25 ug via INTRAVENOUS

## 2018-07-13 MED ORDER — SODIUM CHLORIDE 0.9 % IV SOLN: INTRAVENOUS | Status: AC

## 2018-07-13 MED ORDER — VERAPAMIL HCL 2.5 MG/ML IV SOLN
INTRAVENOUS | Status: AC
  Filled 2018-07-13: qty 2

## 2018-07-13 MED ORDER — MIDAZOLAM HCL 2 MG/2ML IJ SOLN
INTRAMUSCULAR | Status: AC
  Filled 2018-07-13: qty 2

## 2018-07-13 MED ORDER — SODIUM CHLORIDE 0.9% FLUSH: 3.0000 mL | Freq: Two times a day (BID) | INTRAVENOUS | Status: DC

## 2018-07-13 MED ORDER — MIDAZOLAM HCL 2 MG/2ML IJ SOLN
INTRAMUSCULAR | Status: DC | PRN
  Administered 2018-07-13: 2 mg via INTRAVENOUS
  Administered 2018-07-13: 1 mg via INTRAVENOUS

## 2018-07-13 MED ORDER — HEPARIN (PORCINE) IN NACL 1000-0.9 UT/500ML-% IV SOLN
INTRAVENOUS | Status: DC | PRN
  Administered 2018-07-13: 500 mL

## 2018-07-13 MED ORDER — SODIUM CHLORIDE 0.9 % IV SOLN: 250.0000 mL | INTRAVENOUS | Status: DC | PRN

## 2018-07-13 MED ORDER — SODIUM CHLORIDE 0.9% FLUSH: 3.0000 mL | INTRAVENOUS | Status: DC | PRN

## 2018-07-13 MED ORDER — HEPARIN SODIUM (PORCINE) 1000 UNIT/ML IJ SOLN
INTRAMUSCULAR | Status: DC | PRN
  Administered 2018-07-13: 4000 [IU] via INTRAVENOUS
  Administered 2018-07-13: 2000 [IU] via INTRAVENOUS

## 2018-07-13 MED ORDER — SODIUM CHLORIDE 0.9 % WEIGHT BASED INFUSION
3.0000 mL/kg/h | INTRAVENOUS | Status: AC
  Administered 2018-07-13: 3 mL/kg/h via INTRAVENOUS

## 2018-07-13 MED ORDER — LIDOCAINE HCL (PF) 1 % IJ SOLN
INTRAMUSCULAR | Status: DC | PRN
  Administered 2018-07-13: 1 mL
  Administered 2018-07-13: 2 mL

## 2018-07-13 MED ORDER — ACETAMINOPHEN 325 MG PO TABS
650.0000 mg | ORAL_TABLET | ORAL | Status: DC | PRN
  Administered 2018-07-13: 650 mg via ORAL
  Filled 2018-07-13: qty 2

## 2018-07-13 MED ORDER — IOHEXOL 350 MG/ML SOLN
INTRAVENOUS | Status: DC | PRN
  Administered 2018-07-13: 75 mL via INTRA_ARTERIAL

## 2018-07-13 MED ORDER — FENTANYL CITRATE (PF) 100 MCG/2ML IJ SOLN
INTRAMUSCULAR | Status: AC
  Filled 2018-07-13: qty 2

## 2018-07-13 MED ORDER — SODIUM CHLORIDE 0.9 % WEIGHT BASED INFUSION: 1.0000 mL/kg/h | INTRAVENOUS | Status: DC

## 2018-07-13 MED ORDER — ONDANSETRON HCL 4 MG/2ML IJ SOLN: 4.0000 mg | Freq: Four times a day (QID) | INTRAMUSCULAR | Status: DC | PRN

## 2018-07-13 MED ORDER — VERAPAMIL HCL 2.5 MG/ML IV SOLN
INTRAVENOUS | Status: DC | PRN
  Administered 2018-07-13: 10 mL via INTRA_ARTERIAL

## 2018-07-13 MED ORDER — ASPIRIN 81 MG PO CHEW: 81.0000 mg | CHEWABLE_TABLET | ORAL | Status: DC

## 2018-07-13 SURGICAL SUPPLY — 14 items
CATH 5FR JL3.5 JR4 ANG PIG MP (CATHETERS) ×2 IMPLANT
CATH BALLN WEDGE 5F 110CM (CATHETERS) ×2 IMPLANT
CATH INFINITI 5FR AL1 (CATHETERS) ×2 IMPLANT
DEVICE RAD COMP TR BAND LRG (VASCULAR PRODUCTS) ×2 IMPLANT
GLIDESHEATH SLEND SS 6F .021 (SHEATH) ×2 IMPLANT
GUIDEWIRE INQWIRE 1.5J.035X260 (WIRE) ×1 IMPLANT
INQWIRE 1.5J .035X260CM (WIRE) ×2
KIT HEART LEFT (KITS) ×2 IMPLANT
PACK CARDIAC CATHETERIZATION (CUSTOM PROCEDURE TRAY) ×2 IMPLANT
SHEATH GLIDE SLENDER 4/5FR (SHEATH) ×2 IMPLANT
SHEATH PROBE COVER 6X72 (BAG) ×2 IMPLANT
TRANSDUCER W/STOPCOCK (MISCELLANEOUS) ×2 IMPLANT
TUBING CIL FLEX 10 FLL-RA (TUBING) ×2 IMPLANT
WIRE EMERALD ST .035X150CM (WIRE) ×2 IMPLANT

## 2018-07-13 NOTE — Discharge Instructions (Signed)
Radial Site Care ° °This sheet gives you information about how to care for yourself after your procedure. Your health care provider may also give you more specific instructions. If you have problems or questions, contact your health care provider. °What can I expect after the procedure? °After the procedure, it is common to have: °· Bruising and tenderness at the catheter insertion area. °Follow these instructions at home: °Medicines °· Take over-the-counter and prescription medicines only as told by your health care provider. °Insertion site care °· Follow instructions from your health care provider about how to take care of your insertion site. Make sure you: °? Wash your hands with soap and water before you change your bandage (dressing). If soap and water are not available, use hand sanitizer. °? Change your dressing as told by your health care provider. °? Leave stitches (sutures), skin glue, or adhesive strips in place. These skin closures may need to stay in place for 2 weeks or longer. If adhesive strip edges start to loosen and curl up, you may trim the loose edges. Do not remove adhesive strips completely unless your health care provider tells you to do that. °· Check your insertion site every day for signs of infection. Check for: °? Redness, swelling, or pain. °? Fluid or blood. °? Pus or a bad smell. °? Warmth. °· Do not take baths, swim, or use a hot tub until your health care provider approves. °· You may shower 24-48 hours after the procedure, or as directed by your health care provider. °? Remove the dressing and gently wash the site with plain soap and water. °? Pat the area dry with a clean towel. °? Do not rub the site. That could cause bleeding. °· Do not apply powder or lotion to the site. °Activity ° °· For 24 hours after the procedure, or as directed by your health care provider: °? Do not flex or bend the affected arm. °? Do not push or pull heavy objects with the affected arm. °? Do not  drive yourself home from the hospital or clinic. You may drive 24 hours after the procedure unless your health care provider tells you not to. °? Do not operate machinery or power tools. °· Do not lift anything that is heavier than 10 lb (4.5 kg), or the limit that you are told, until your health care provider says that it is safe. °· Ask your health care provider when it is okay to: °? Return to work or school. °? Resume usual physical activities or sports. °? Resume sexual activity. °General instructions °· If the catheter site starts to bleed, raise your arm and put firm pressure on the site. If the bleeding does not stop, get help right away. This is a medical emergency. °· If you went home on the same day as your procedure, a responsible adult should be with you for the first 24 hours after you arrive home. °· Keep all follow-up visits as told by your health care provider. This is important. °Contact a health care provider if: °· You have a fever. °· You have redness, swelling, or yellow drainage around your insertion site. °Get help right away if: °· You have unusual pain at the radial site. °· The catheter insertion area swells very fast. °· The insertion area is bleeding, and the bleeding does not stop when you hold steady pressure on the area. °· Your arm or hand becomes pale, cool, tingly, or numb. °These symptoms may represent a serious problem   that is an emergency. Do not wait to see if the symptoms will go away. Get medical help right away. Call your local emergency services (911 in the [place] Do not drive yourself to the hospital. °Summary °· After the procedure, it is common to have bruising and tenderness at the site. °· Follow instructions from your health care provider about how to take care of your radial site wound. Check the wound every day for signs of infection. °· Do not lift anything that is heavier than 10 lb (4.5 kg), or the limit that you are told, until your health care provider says  that it is safe. °This information is not intended to replace advice given to you by your health care provider. Make sure you discuss any questions you have with your health care provider. °Document Released: 06/14/2010 Document Revised: 06/17/2017 Document Reviewed: 06/17/2017 °Elsevier Interactive Patient Education © 2019 Elsevier Inc. ° °

## 2018-07-13 NOTE — Interval H&P Note (Signed)
Cath Lab Visit (complete for each Cath Lab visit)  Clinical Evaluation Leading to the Procedure:   ACS: No.  Non-ACS:    Anginal Classification: CCS III  Anti-ischemic medical therapy: Minimal Therapy (1 class of medications)  Non-Invasive Test Results: No non-invasive testing performed  Prior CABG: No previous CABG  Severe AS by echo.  Sx sound more like AS with chest burning with exertion and near syncope.     History and Physical Interval Note:  07/13/2018 7:30 AM  Joseph Morse  has presented today for surgery, with the diagnosis of cp  The various methods of treatment have been discussed with the patient and family. After consideration of risks, benefits and other options for treatment, the patient has consented to  Procedure(s): RIGHT/LEFT HEART CATH AND CORONARY ANGIOGRAPHY (N/A) as a surgical intervention .  The patient's history has been reviewed, patient examined, no change in status, stable for surgery.  I have reviewed the patient's chart and labs.  Questions were answered to the patient's satisfaction.     Lance Muss

## 2018-07-15 ENCOUNTER — Other Ambulatory Visit: Payer: Self-pay

## 2018-07-15 ENCOUNTER — Institutional Professional Consult (permissible substitution): Payer: Medicare Other | Admitting: Surgery

## 2018-07-15 ENCOUNTER — Encounter: Payer: Self-pay | Admitting: Surgery

## 2018-07-15 VITALS — BP 143/75 | HR 67 | Resp 18 | Ht 69.5 in | Wt 174.2 lb

## 2018-07-15 DIAGNOSIS — I2510 Atherosclerotic heart disease of native coronary artery without angina pectoris: Secondary | ICD-10-CM | POA: Diagnosis not present

## 2018-07-16 ENCOUNTER — Encounter: Payer: Self-pay | Admitting: *Deleted

## 2018-07-16 ENCOUNTER — Other Ambulatory Visit: Payer: Self-pay | Admitting: *Deleted

## 2018-07-16 DIAGNOSIS — I350 Nonrheumatic aortic (valve) stenosis: Secondary | ICD-10-CM

## 2018-07-16 DIAGNOSIS — I2510 Atherosclerotic heart disease of native coronary artery without angina pectoris: Secondary | ICD-10-CM

## 2018-07-19 ENCOUNTER — Encounter: Payer: Self-pay | Admitting: Surgery

## 2018-07-19 ENCOUNTER — Telehealth: Payer: Self-pay | Admitting: Interventional Cardiology

## 2018-07-19 MED ORDER — AMOXICILLIN 500 MG PO CAPS: ORAL_CAPSULE | ORAL | 3 refills | Status: DC

## 2018-07-19 NOTE — Telephone Encounter (Signed)
New Message:    Pt is having dental work tomorrow, he needs an antibiotic called in please. Pt is having heart surgery next Friday(07-30-18). Please call this to [facility]- [address] [place].

## 2018-07-19 NOTE — Progress Notes (Signed)
Cardiothoracic Surgery Consultation  PCP is Joseph Blamer, MD Referring Provider is Joseph Crafts, MD  Chief Complaint  Patient presents with  . 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  His echocardiogram in 03/2017 showed a bicuspid aortic valve with calcified leaflets and restricted mobility.  The mean gradient was 22 mmHg with a peak gradient 34 mmHg and a dimensionless index of 0.14.  The valve area was measured at that time as 0.45 cm.  This was felt to represent moderate aortic stenosis.  The left ventricular ejection fraction was 60 to 65% with grade 1 diastolic dysfunction.  He recently presented with a several month history of exertional substernal chest discomfort that he describes as a burning sensation as well as shortness of breath occurring with exertion and relieved with rest.  He is to build to walk several miles without any difficulty and now has to stop multiple times to rest over the past few months.  He is also noticed some chest pressure and shortness of breath with walking up stairs.  He is also had some dizziness and felt like he may pass out a couple times.  A repeat echocardiogram on 07/12/2018 showed a severely thickened and calcified aortic valve that appeared functionally bicuspid.  The mean gradient was measured at 29 mmHg with a peak velocity of 3.84 m/s.  Dimensionless index was less than 0.25 with a calculated aortic valve area of 0.81 cm.  Left ventricular ejection fraction remains normal at 55 to 60%.  There is mild dilation of the aortic root measured at 37 mm.  Cardiac catheterization was performed on 07/13/2018.  This showed the drug-eluting stents  in the mid right coronary artery patent with mild in-stent restenosis.  The LAD had no significant stenosis.  Left circumflex had 80% stenosis at the bifurcation of a large marginal branch which was unchanged from prior catheterization in 2010.  It was not possible to cross the aortic valve with an AL-1 catheter.  Right heart pressures were within normal limits.  The patient is here today with his wife.  He is retired. Past Medical History:  Diagnosis Date  . Chest pain   . HTN (hypertension)   . Hyperlipidemia, mixed   . S/P right coronary artery (RCA) stent placement     Past Surgical History:  Procedure Laterality Date  . RIGHT HEART CATH AND CORONARY ANGIOGRAPHY N/A 07/13/2018   Procedure: RIGHT HEART CATH AND CORONARY ANGIOGRAPHY;  Surgeon: Joseph Crafts, MD;  Location: [facility] INVASIVE CV LAB;  Service: Cardiovascular;  Laterality: N/A;    Family History  Problem Relation Age of Onset  . Heart attack Father   . CVA Mother   . Hyperlipidemia Sister   . Cancer Maternal Grandfather     Social History Social History   Tobacco Use  . Smoking status: Former Games developer  . Smokeless tobacco: Never Used  Substance Use Topics  . Alcohol use: No    Frequency: Never  . Drug use: No    Current Outpatient Medications  Medication Sig Dispense Refill  . aspirin EC 81 MG tablet Take 81 mg by mouth daily.    Marland Kitchen atorvastatin (LIPITOR) 40 MG tablet TAKE 1 TABLET BY MOUTH DAILY (  Patient taking differently: Take 40 mg by mouth daily. TAKE 1 TABLET BY MOUTH DAILY) 90 tablet 3  . carvedilol (COREG) 3.125 MG tablet Take 1 tablet (3.125 mg total) by mouth 2 (two) times daily. 180 tablet 3  . clopidogrel (PLAVIX) 75 MG tablet Take 1 tablet (75 mg total) by mouth daily. 90 tablet 3  . lisinopril (PRINIVIL,ZESTRIL) 20 MG tablet Take 1 tablet (20 mg total) by mouth daily. 90 tablet 3  . nitroGLYCERIN (NITROSTAT) 0.4 MG SL tablet Place 1 tablet (0.4 mg total) under the tongue every 5 (five) minutes as  needed. 25 tablet 3  . amoxicillin (AMOXIL) 500 MG capsule Take 4 capsules 1 hour prior to dental procedures 4 capsule 3   No current facility-administered medications for this visit.     No Known Allergies  Review of Systems  Constitutional: Positive for activity change and fatigue.  HENT: Negative.  Negative for dental problem.        Sees a dentist regularly  Eyes: Negative.   Respiratory: Positive for shortness of breath.   Cardiovascular: Positive for chest pain. Negative for palpitations and leg swelling.  Gastrointestinal: Negative.   Endocrine: Negative.   Genitourinary: Negative.   Musculoskeletal: Negative.   Allergic/Immunologic: Negative.   Neurological: Positive for dizziness and light-headedness. Negative for syncope.  Hematological: Negative.   Psychiatric/Behavioral: Negative.     BP (!) 143/75 (BP Location: Right Arm, Patient Position: Sitting, Cuff Size: Large)   Pulse 67   Resp 18   Ht 5' 9.5" (1.765 m)   Wt 174 lb 3.2 oz (79 kg)   SpO2 98% Comment: RA  BMI 25.36 kg/m  Physical Exam Constitutional:      Appearance: Normal appearance. He is normal weight.  HENT:     Head: Normocephalic and atraumatic.     Mouth/Throat:     Mouth: Mucous membranes are moist.     Pharynx: Oropharynx is clear.     Comments: Teeth in good condition Eyes:     Extraocular Movements: Extraocular movements intact.     Conjunctiva/sclera: Conjunctivae normal.     Pupils: Pupils are equal, round, and reactive to light.  Neck:     Musculoskeletal: Normal range of motion and neck supple.  Cardiovascular:     Rate and Rhythm: Normal rate and regular rhythm.     Pulses: Normal pulses.     Heart sounds: Murmur present.     Comments: 3/6 systolic murmur along the right sternal border Pulmonary:     Effort: Pulmonary effort is normal.     Breath sounds: Normal breath sounds.  Abdominal:     General: Abdomen is flat. Bowel sounds are normal.     Palpations: Abdomen is soft.       Tenderness: There is no abdominal tenderness.  Musculoskeletal: Normal range of motion.        General: No swelling.  Skin:    General: Skin is warm and dry.  Neurological:     General: No focal deficit present.     Mental Status: He is alert.  Psychiatric:        Mood and Affect: Mood normal.        Behavior: Behavior normal.        Thought Content: Thought content normal.        Judgment: Judgment normal.     Diagnostic Tests:  ECHOCARDIOGRAM REPORT       Patient Name:   Joseph Morse Date of Exam: 07/12/2018 Medical Rec #:  [id number]        Height:       69.5 in Accession #:    [id number]       Weight:       170.2 lb Date of Birth:  [birth year]        BSA:          1.94 m Patient Age:    67 years         BP:           150/76 mmHg Patient Gender: M                HR:           64 bpm. Exam Location:  [address]    Procedure: 2D Echo, 3D Echo, Cardiac Doppler and Color Doppler  Indications:     R06.09 Dyspnea on exertion   History:         Patient has prior history of Echocardiogram examinations, most                  recent 04/23/2017. Aortic stenosis; Signs/Symptoms: Dyspnea and                  Dilated aortic root.   Sonographer:     Samule Ohm RDCS Referring Phys:  [id number] Manson Passey Diagnosing Phys: Jodelle Red MD  IMPRESSIONS    1. The left ventricle has normal systolic function, with an ejection fraction of 55-60%. The cavity size was normal. There is mild asymmetric left ventricular hypertrophy of the septal wall. Left ventricular diastolic Doppler parameters are consistent  with impaired relaxation There is abnormal septal motion consistent with left bundle branch block. No evidence of left ventricular regional wall motion abnormalities.  2. Functionally bicuspid Severely thickening of the aortic valve Severe calcifcation of the aortic valve. Aortic valve regurgitation is mild by color flow Doppler. moderate-severe  stenosis of the aortic valve.  3. Severely thickened, severely calcificed aortic valve that appears at least functionally bicuspid. Aortic stenosis is severe by valve area; while this may be due to difficulty measuring LVOT accurately, the dimensionless index also supports severe AS  (<0.25). Peak velocity 3.84 m/s, peak gradient 59 mmHg, mean gradient 29 mmHg are in moderate-severe range.  4. The right ventricle has normal systolic function. The cavity was normal. There is no increase in right ventricular wall thickness. Right ventricular systolic pressure could not be assessed.  5. Left atrial size was mildly dilated.  6. The mitral valve is normal in structure. There is moderate mitral annular calcification present.  7. The tricuspid valve is normal in structure.  8. The pulmonic valve was normal in structure.  9. There is mild dilatation of the aortic root measuring 37 mm. 10. No evidence of left ventricular regional wall motion abnormalities. 11. Right atrial pressure is estimated at 3 mmHg.  FINDINGS  Left Ventricle: The left ventricle has normal systolic function, with an ejection fraction of 55-60%. The cavity size was normal. There is mild asymmetric left ventricular hypertrophy of the septal wall. Left ventricular diastolic Doppler parameters are  consistent with impaired relaxation There is abnormal (paradoxical) septal motion, consistent with left bundle branch block. No evidence of left ventricular regional wall motion abnormalities.. Right Ventricle: The right ventricle has normal systolic function. The cavity was normal. There is no increase in right ventricular wall thickness. Right ventricular systolic pressure could not be assessed.     Left Atrium: left atrial size was mildly dilated Right  Atrium: right atrial size was normal in size. Right atrial pressure is estimated at 3 mmHg. Right atrial pressure is estimated at 3 mmHg. Interatrial Septum: No atrial level shunt  detected by color flow Doppler. Pericardium: There is no evidence of pericardial effusion. Mitral Valve: The mitral valve is normal in structure. There is moderate mitral annular calcification present. Mitral valve regurgitation is trivial by color flow Doppler. Tricuspid Valve: The tricuspid valve is normal in structure. Tricuspid valve regurgitation is trivial by color flow Doppler. Aortic Valve: functionally bicuspid Severely thickening of the aortic valve, Severe calcifcation of the aortic valve and, with moderately decreased cusp excursion. Aortic valve regurgitation is mild by color flow Doppler. There is moderate-severe  stenosis of the aortic valve, with a calculated valve area of 0.81 cm. Pulmonic Valve: The pulmonic valve was normal in structure. Pulmonic valve regurgitation is not visualized by color flow Doppler. Aorta: There is mild dilatation of the aortic root measuring 37 mm. Venous: The inferior vena cava measures 1.10 cm, is normal in size with greater than 50% respiratory variability.   LEFT VENTRICLE PLAX 2D (Teich) LV EF:          54.9 %   Diastology LVIDd:          4.90 cm  LV e' lateral:   6.31 cm/s LVIDs:          3.50 cm  LV E/e' lateral: 12.6 LV PW:          1.30 cm  LV e' medial:    4.13 cm/s LV IVS:         1.70 cm  LV E/e' medial:  19.3 LVOT diam:      2.16 cm LV SV:          62 ml LVOT Area:      3.66 cm                          3D Volume EF                          LV 3D EF:    55.00 %                          LV 3D EDV:   255.00 mm                          LV 3D ESV:   115.00 mm                          LV 3D SV:    140.00 mm  RIGHT VENTRICLE RV S prime:     7.18 cm/s TAPSE (M-mode): 1.5 cm  LEFT ATRIUM             Index       RIGHT ATRIUM           Index LA diam:        3.80 cm 1.96 cm/m  RA Pressure: 3 mmHg LA Vol (A2C):   81.6 ml 42.09 ml/m RA Area:     11.50 cm LA Vol (A4C):   51.1 ml 26.36 ml/m RA Volume:   23.10 ml  11.92 ml/m LA  Biplane Vol: 70.7 ml 36.47 ml/m  AORTIC VALVE AV Area (Vmax):    0.73 cm AV Area (Vmean):  0.73 cm AV Area (VTI):     0.81 cm AV Vmax:           384.00 cm/s AV Vmean:          275.000 cm/s AV VTI:            0.899 m AV Peak Grad:      59.0 mmHg AV Mean Grad:      29.3 mmHg LVOT Vmax:         77.00 cm/s LVOT Vmean:        55.100 cm/s LVOT VTI:          0.200 m LVOT/AV VTI ratio: 0.22 AR PHT:            394 msec   AORTA Ao Root diam: 3.20 cm Ao Asc diam:  3.70 cm  MITRAL VALVE MV Area (PHT): MV PHT: MV Decel Time: 240 msec MV E velocity: 79.70 cm/s MV A velocity: 94.70 cm/s MV E/A ratio:  0.84  IVC IVC diam: 1.10 cm    Jodelle Red MD Electronically signed by Jodelle Red MD Signature Date/Time: 07/12/2018/12:39:15 PM    Physicians   Panel Physicians Referring Physician Case Authorizing Physician  Joseph Crafts, MD (Primary)    Procedures   RIGHT HEART CATH AND CORONARY ANGIOGRAPHY  Conclusion     Mid RCA stent is patent with mild in-stent restenosis  Prox RCA lesion is 30% stenosed.  Ost RPDA lesion is 25% stenosed.  Prox Cx lesion is 80% stenosed. This lesion appears unchanged from the 2010 cardiac cath.  Ost Cx to Prox Cx lesion is 50% stenosed.  Mid Cx to Dist Cx lesion is 90% stenosed. This lesion appears unchanged from the 2010 cardiac cath.  2nd Mrg lesion is 75% stenosed. Lat 2nd Mrg lesion is 75% stenosed. This bifurcation area in the OM represents new disease compared to 2010.  Prox LAD lesion is 25% stenosed.  Unable to cross aortic valve despite use of straight wire with AL-1 catheter.   Given the dramatic worsening of his symptoms, and the changes on his echocardiogram in terms of his aortic stenosis from 2018, I suspect his symptoms are coming more from aortic stenosis rather than coronary artery disease.  He has only had mild progression of his coronary disease over 10 years.  Will refer to surgery  for aortic valve replacement with bypass to the circumflex territory.  He will need to avoid any strenuous activities given the presyncope that he has felt, as he walks up hills.  Continue Plavix for now.  Once he sees the surgeon and a date is set for surgery, they can hold the Plavix 5 days prior to surgery, at that point.   Indications   Nonrheumatic aortic valve stenosis [I35.0 (ICD-10-CM)]  Angina pectoris (HCC) [I20.9 (ICD-10-CM)]  Procedural Details   Technical Details The risks, benefits, and details of the procedure were explained to the patient. The patient verbalized understanding and wanted to proceed. Informed written consent was obtained.  PROCEDURE TECHNIQUE: After Xylocaine anesthesia, a 5 French sheath was placed in the right antecubital area and exchanged for a peripheral IV. A 5 French balloontipped Swan-Ganz catheter was advanced to the pulmonary artery under fluoroscopic guidance. Hemodynamic pressures were obtained. Oxygen saturations were obtained. After Xylocaine anesthesia, a 47F sheath was placed in the right radial artery with a single anterior needle wall stick using ultrasound guidance. An image was captured and stored. Left coronary angiography was done using a Judkins L3.5 guide catheter.  Right coronary angiography was done using a Judkins R4 guide catheter. Left heart cath was attempted using a JR4, AL-1 catheter and straight wire. We were unable to cross the aortic valve.    Contrast: 75 cc  Estimated blood loss <50 mL.   During this procedure medications were administered to achieve and maintain moderate conscious sedation while the patient's heart rate, blood pressure, and oxygen saturation were continuously monitored and I was present face-to-face 100% of this time.  Medications  (Filter: Administrations occurring from 07/13/18 0721 to 07/13/18 0834)  Medication Rate/Dose/Volume Action  Date Time   Heparin (Porcine) in NaCl 1000-0.9 UT/500ML-% SOLN (mL)  500 mL Given 07/13/18 0728   Total dose as of 07/19/18 1240        500 mL        Heparin (Porcine) in NaCl 1000-0.9 UT/500ML-% SOLN (mL) 500 mL Given 07/13/18 0728   Total dose as of 07/19/18 1240        500 mL        fentaNYL (SUBLIMAZE) injection (mcg) 25 mcg Given 07/13/18 0732   Total dose as of 07/19/18 1240 25 mcg Given 0810   50 mcg        midazolam (VERSED) injection (mg) 2 mg Given 07/13/18 0732   Total dose as of 07/19/18 1240 1 mg Given 0810   3 mg        lidocaine (PF) (XYLOCAINE) 1 % injection (mL) 1 mL Given 07/13/18 0744   Total dose as of 07/19/18 1240 2 mL Given 0745   3 mL        Radial Cocktail/Verapamil only (mL) 10 mL Given 07/13/18 0749   Total dose as of 07/19/18 1240        10 mL        heparin injection (Units) 4,000 Units Given 07/13/18 0758   Total dose as of 07/19/18 1240 2,000 Units Given 0809   6,000 Units        iohexol (OMNIPAQUE) 350 MG/ML injection (mL) 75 mL Given 07/13/18 0825   Total dose as of 07/19/18 1240        75 mL        Sedation Time   Sedation Time Physician-1: 51 minutes 4 seconds  Complications   Complications documented before study signed (07/13/2018 8:42 AM EST)    No complications were associated with this study.  Documented by Joseph Crafts, MD - 07/13/2018 8:42 AM EST    Coronary Findings   Diagnostic  Dominance: Right  Left Anterior Descending  There is mild diffuse disease throughout the vessel.  Prox LAD lesion 25% stenosed  Prox LAD lesion is 25% stenosed. The lesion is eccentric. The lesion is moderately calcified.  Left Circumflex  Ost Cx to Prox Cx lesion 50% stenosed  Ost Cx to Prox Cx lesion is 50% stenosed.  Prox Cx lesion 80% stenosed  Prox Cx lesion is 80% stenosed.  Mid Cx to Dist Cx lesion 90% stenosed  Mid Cx to Dist Cx lesion is 90% stenosed.  Second Obtuse Marginal Branch  2nd Mrg lesion 75% stenosed  2nd Mrg lesion is 75% stenosed.  Lateral Second Obtuse Marginal Branch  Lat 2nd Mrg  lesion 75% stenosed  Lat 2nd Mrg lesion is 75% stenosed.  Right Coronary Artery  Prox RCA lesion 30% stenosed  Prox RCA lesion is 30% stenosed.  Mid RCA lesion 30% stenosed  Mid RCA lesion is 30% stenosed. The lesion was previously treated using a drug eluting  stent over 2 years ago.  Right Posterior Descending Artery  Ost RPDA lesion 25% stenosed  Ost RPDA lesion is 25% stenosed.  Intervention   No interventions have been documented.  Right Heart   Right Heart Pressures AO saturation 98%, PA saturation 76%, mean PA pressure 18 mmHg, mean wedge pressure 13 mmHg, cardiac output 5.75 L/min, cardiac index 2.9  Coronary Diagrams   Diagnostic  Dominance: Right    Intervention   Implants    No implant documentation for this case.  Syngo Images   Show images for CARDIAC CATHETERIZATION  MERGE Images   Show images for CARDIAC CATHETERIZATION   Link to Procedure Log   Procedure Log    Hemo Data    Most Recent Value  Fick Cardiac Output 5.75 L/min  Fick Cardiac Output Index 2.98 (L/min)/BSA  RA A Wave 7 mmHg  RA V Wave 5 mmHg  RA Mean 4 mmHg  RV Systolic Pressure 32 mmHg  RV Diastolic Pressure 0 mmHg  RV EDP 4 mmHg  PA Systolic Pressure 27 mmHg  PA Diastolic Pressure 10 mmHg  PA Mean 18 mmHg  PW A Wave 18 mmHg  PW V Wave 16 mmHg  PW Mean 13 mmHg  AO Systolic Pressure 115 mmHg  AO Diastolic Pressure 53 mmHg  AO Mean 76 mmHg  QP/QS 1  TPVR Index 6.03 HRUI  TSVR Index 25.46 HRUI  PVR SVR Ratio 0.07  TPVR/TSVR Ratio 0.24    Impression:  This 69 year old gentleman has stage D, severe, symptomatic aortic stenosis with New York Heart Association class II-III symptoms of exertional fatigue and shortness of breath associated with exertional chest discomfort and dizziness consistent with chronic diastolic congestive heart failure.  Cardiac catheterization shows 80% proximal left circumflex stenosis compromising a large marginal branch.  His symptoms sound like they are  more likely related to progressive aortic stenosis.  I reviewed the cardiac catheterization films and the echocardiogram images with the patient and his wife and answered their questions.  I think the best option is to proceed with coronary artery bypass graft surgery to the left circumflex system and aortic valve replacement using a bioprosthetic pericardial valve. I discussed the operative procedure with the patient and his wife including alternatives, benefits and risks; including but not limited to bleeding, blood transfusion, infection, stroke, myocardial infarction, graft failure, heart block requiring a permanent pacemaker, organ dysfunction, and death.  Felicity Pellegrini understands and agrees to proceed.     Plan:  He will be scheduled for aortic valve replacement using a bioprosthetic valve and coronary bypass graft surgery on Friday, July 30, 2018.   I spent 80 minutes performing this consultation and > 50% of this time was spent face to face counseling and coordinating the care of this patient's severe symptomatic aortic stenosis and coronary disease.   Alleen Borne, MD [facility] [phone]

## 2018-07-19 NOTE — Telephone Encounter (Signed)
Returned call to patient's wife. She states that the patient is scheduled for CABG and AVR on 3/6. She states that the patient is scheduled for dental work tomorrow and is requesting abx. Discussed with Chelsea Aus, PA. We will call in 2 grams of amoxicillin 1 hour prior to dental procedure. We will cancel post cath f/u appointment for 3/5 and follow up will be arranged post surgery. Wife verbalized understanding and thanked me for the call.

## 2018-07-28 ENCOUNTER — Other Ambulatory Visit: Payer: Self-pay

## 2018-07-28 ENCOUNTER — Encounter (HOSPITAL_COMMUNITY)
Admission: RE | Admit: 2018-07-28 | Discharge: 2018-07-28 | Disposition: A | Discharge status: Home / Self Care | Payer: Medicare Other | Source: Ambulatory Visit | Attending: Surgery | Admitting: Surgery

## 2018-07-28 ENCOUNTER — Ambulatory Visit (HOSPITAL_COMMUNITY)
Admission: RE | Admit: 2018-07-28 | Discharge: 2018-07-28 | Disposition: A | Discharge status: Home / Self Care | Payer: Medicare Other | Source: Ambulatory Visit | Attending: Surgery | Admitting: Surgery

## 2018-07-28 ENCOUNTER — Encounter (HOSPITAL_COMMUNITY): Payer: Self-pay

## 2018-07-28 ENCOUNTER — Ambulatory Visit (HOSPITAL_BASED_OUTPATIENT_CLINIC_OR_DEPARTMENT_OTHER)
Admission: RE | Admit: 2018-07-28 | Discharge: 2018-07-28 | Disposition: A | Discharge status: Home / Self Care | Payer: Medicare Other | Source: Ambulatory Visit | Attending: Surgery | Admitting: Surgery

## 2018-07-28 DIAGNOSIS — I350 Nonrheumatic aortic (valve) stenosis: Secondary | ICD-10-CM | POA: Diagnosis not present

## 2018-07-28 DIAGNOSIS — I2510 Atherosclerotic heart disease of native coronary artery without angina pectoris: Secondary | ICD-10-CM | POA: Diagnosis not present

## 2018-07-28 HISTORY — DX: Unspecified abdominal hernia without obstruction or gangrene: K46.9

## 2018-07-28 HISTORY — DX: Headache, unspecified: R51.9

## 2018-07-28 HISTORY — DX: Personal history of urinary calculi: Z87.442

## 2018-07-28 HISTORY — DX: Headache: R51

## 2018-07-28 HISTORY — DX: Postnasal drip: R09.82

## 2018-07-28 HISTORY — DX: Atherosclerotic heart disease of native coronary artery without angina pectoris: I25.10

## 2018-07-28 HISTORY — DX: Dyspnea, unspecified: R06.00

## 2018-07-28 HISTORY — DX: Nonrheumatic aortic (valve) stenosis: I35.0

## 2018-07-28 LAB — PULMONARY FUNCTION TEST
DL/VA % pred: 110 %
DL/VA: 4.53 ml/min/mmHg/L
DLCO cor % pred: 92 %
DLCO cor: 24 ml/min/mmHg
DLCO unc % pred: 86 %
DLCO unc: 22.52 ml/min/mmHg
FEF 25-75 Post: 3.89 L/sec
FEF 25-75 Pre: 2.7 L/sec
FEF2575-%Change-Post: 44 %
FEF2575-%Pred-Post: 151 %
FEF2575-%Pred-Pre: 104 %
FEV1-%Change-Post: 9 %
FEV1-%Pred-Post: 92 %
FEV1-%Pred-Pre: 84 %
FEV1-Post: 3.04 L
FEV1-Pre: 2.78 L
FEV1FVC-%Change-Post: 1 %
FEV1FVC-%Pred-Pre: 109 %
FEV6-%Change-Post: 7 %
FEV6-%Pred-Post: 87 %
FEV6-%Pred-Pre: 80 %
FEV6-Post: 3.67 L
FEV6-Pre: 3.4 L
FEV6FVC-%Change-Post: 0 %
FEV6FVC-%PRED-POST: 105 %
FEV6FVC-%Pred-Pre: 105 %
FVC-%Change-Post: 8 %
FVC-%PRED-POST: 82 %
FVC-%Pred-Pre: 76 %
FVC-POST: 3.69 L
FVC-Pre: 3.42 L
Post FEV1/FVC ratio: 82 %
Post FEV6/FVC ratio: 100 %
Pre FEV1/FVC ratio: 81 %
Pre FEV6/FVC Ratio: 100 %
RV % pred: 116 %
RV: 2.76 L
TLC % PRED: 89 %
TLC: 6.18 L

## 2018-07-28 LAB — COMPREHENSIVE METABOLIC PANEL
ALBUMIN: 4.4 g/dL (ref 3.5–5.0)
ALK PHOS: 55 U/L (ref 38–126)
ALT: 23 U/L (ref 0–44)
AST: 24 U/L (ref 15–41)
Anion gap: 8 (ref 5–15)
BUN: 12 mg/dL (ref 8–23)
CO2: 26 mmol/L (ref 22–32)
CREATININE: 1.14 mg/dL (ref 0.61–1.24)
Calcium: 9.3 mg/dL (ref 8.9–10.3)
Chloride: 104 mmol/L (ref 98–111)
GFR calc Af Amer: >60 mL/min (ref >60)
GFR calc non Af Amer: >60 mL/min (ref >60)
Glucose, Bld: 105 mg/dL — ABNORMAL HIGH (ref 70–99)
Potassium: 3.8 mmol/L (ref 3.5–5.1)
Sodium: 138 mmol/L (ref 135–145)
Total Bilirubin: 1 mg/dL (ref 0.3–1.2)
Total Protein: 7 g/dL (ref 6.5–8.1)

## 2018-07-28 LAB — CBC
HCT: 45.5 % (ref 39.0–52.0)
Hemoglobin: 14.7 g/dL (ref 13.0–17.0)
MCH: 29.5 pg (ref 26.0–34.0)
MCHC: 32.3 g/dL (ref 30.0–36.0)
MCV: 91.4 fL (ref 80.0–100.0)
Platelets: 271 10*3/uL (ref 150–400)
RBC: 4.98 MIL/uL (ref 4.22–5.81)
RDW: 13.2 % (ref 11.5–15.5)
WBC: 8.2 10*3/uL (ref 4.0–10.5)
nRBC: 0 % (ref 0.0–0.2)

## 2018-07-28 LAB — ABO/RH: ABO/RH(D): A POS

## 2018-07-28 LAB — PROTIME-INR
INR: 1.1 (ref 0.8–1.2)
Prothrombin Time: 14.4 seconds (ref 11.4–15.2)

## 2018-07-28 LAB — APTT: APTT: 31 s (ref 24–36)

## 2018-07-28 LAB — TYPE AND SCREEN
ABO/RH(D): A POS
Antibody Screen: NEGATIVE

## 2018-07-28 LAB — BLOOD GAS, ARTERIAL
Acid-Base Excess: 2.2 mmol/L — ABNORMAL HIGH (ref 0.0–2.0)
BICARBONATE: 26.3 mmol/L (ref 20.0–28.0)
Drawn by: 421801
FIO2: 21
O2 Saturation: 92.4 %
Patient temperature: 98.6
pCO2 arterial: 41.1 mmHg (ref 32.0–48.0)
pH, Arterial: 7.422 (ref 7.350–7.450)
pO2, Arterial: 63.6 mmHg — ABNORMAL LOW (ref 83.0–108.0)

## 2018-07-28 LAB — URINALYSIS, ROUTINE W REFLEX MICROSCOPIC
Bilirubin Urine: NEGATIVE
Glucose, UA: NEGATIVE mg/dL
Hgb urine dipstick: NEGATIVE
Ketones, ur: NEGATIVE mg/dL
Leukocytes,Ua: NEGATIVE
Nitrite: NEGATIVE
Protein, ur: NEGATIVE mg/dL
Specific Gravity, Urine: 1.008 (ref 1.005–1.030)
pH: 7 (ref 5.0–8.0)

## 2018-07-28 LAB — SURGICAL PCR SCREEN
MRSA, PCR: NEGATIVE
Staphylococcus aureus: NEGATIVE

## 2018-07-28 LAB — HEMOGLOBIN A1C
Hgb A1c MFr Bld: 5.4 % (ref 4.8–5.6)
Mean Plasma Glucose: 108.28 mg/dL

## 2018-07-28 MED ORDER — ALBUTEROL SULFATE (2.5 MG/3ML) 0.083% IN NEBU
2.5000 mg | INHALATION_SOLUTION | Freq: Once | RESPIRATORY_TRACT | Status: AC
  Administered 2018-07-28: 2.5 mg via RESPIRATORY_TRACT

## 2018-07-28 NOTE — Anesthesia Preprocedure Evaluation (Addendum)
Anesthesia Evaluation  Patient identified by MRN, date of birth, ID band Patient awake    Reviewed: Allergy & Precautions, H&P , Patient's Chart, lab work & pertinent test results, reviewed documented beta blocker date and time   Airway Mallampati: II  TM Distance: >3 FB Neck ROM: Full    Dental  (+) Teeth Intact, Dental Advisory Given   Pulmonary neg pulmonary ROS, shortness of breath, former smoker,    breath sounds clear to auscultation       Cardiovascular Exercise Tolerance: Good hypertension, Pt. on medications and Pt. on home beta blockers + CAD and + Cardiac Stents  + Valvular Problems/Murmurs AS  Rhythm:Regular Rate:Normal + Systolic murmurs    Neuro/Psych  Headaches, negative psych ROS   GI/Hepatic negative GI ROS, Neg liver ROS,   Endo/Other  negative endocrine ROS  Renal/GU negative Renal ROS  negative genitourinary   Musculoskeletal   Abdominal   Peds  Hematology negative hematology ROS (+)   Anesthesia Other Findings   Reproductive/Obstetrics negative OB ROS                          Anesthesia Physical Anesthesia Plan  ASA: IV  Anesthesia Plan: General   Post-op Pain Management:    Induction: Intravenous  PONV Risk Score and Plan: 2 and Midazolam and Treatment may vary due to age or medical condition  Airway Management Planned: Oral ETT  Additional Equipment: Arterial line, CVP, PA Cath, TEE and Ultrasound Guidance Line Placement  Intra-op Plan:   Post-operative Plan: Post-operative intubation/ventilation  Informed Consent: I have reviewed the patients History and Physical, chart, labs and discussed the procedure including the risks, benefits and alternatives for the proposed anesthesia with the patient or authorized representative who has indicated his/her understanding and acceptance.     Dental advisory given  Plan Discussed with: CRNA, Anesthesiologist and  Surgeon  Anesthesia Plan Comments: (PAT note written 07/28/2018 by Shonna Chock, PA-C. )      Anesthesia Quick Evaluation

## 2018-07-28 NOTE — Pre-Procedure Instructions (Addendum)
   EVARISTO SPENNER  07/28/2018     [id number] - [place], [place] - [address] AT [facility] OF [address] [place] [address] Phone: [phone] Fax: [phone]   Your procedure is scheduled on Friday, July 30, 2018  Report to [facility] Admitting at 5:30 A.M.  Call this number if you have problems the morning of surgery:  [phone]   Remember: Brush your teeth the morning of surgery with your regular toothpaste.  Do not eat or drink after midnight Thursday, July 29, 2018  Take these medicines the morning of surgery with A SIP OF WATER : atorvastatin (LIPITOR), carvedilol (COREG), nasal spray  if needed: nitroGLYCERIN  ( Nitrostat) Stop taking vitamins, fish oil and herbal medications. Do not take any NSAIDs ie: Ibuprofen, Advil, Naproxen (Aleve), Motrin, BC and Goody Powder; stop now.   Do not wear jewelry, make-up or nail polish.  Do not wear lotions, powders, or perfumes, or deodorant.  Do not shave 48 hours prior to surgery.  Men may shave face and neck.  Do not bring valuables to the hospital.  [facility] is not responsible for any belongings or valuables.  Contacts, dentures or bridgework may not be worn into surgery.  Leave your suitcase in the car.  After surgery it may be brought to your room.  Special instructions: See " [facility] Preparing For Surgery" sheet. Please read over the following fact sheets that you were given. Pain Booklet, Coughing and Deep Breathing, MRSA Information and Surgical Site Infection Prevention

## 2018-07-28 NOTE — Progress Notes (Signed)
Anesthesia PAT Evaluation:  Case:  [id number] Date/Time:  07/30/18 0715   Procedures:      CORONARY ARTERY BYPASS GRAFTING (CABG) (N/A Chest)     AORTIC VALVE REPLACEMENT (AVR) (N/A Chest)     TRANSESOPHAGEAL ECHOCARDIOGRAM (TEE) (N/A )   Anesthesia type:  General   Pre-op diagnosis:      CAD     SEVERE AS   Location:  [facility] OR ROOM 17 / [facility] OR   Surgeon:  Alleen Borne, MD      DISCUSSION: Patient is a 68 year old male scheduled for the above procedures.   History includes former smoker, CAD (s/p DES x2 RCA 10/26/08), functional bicuspid AV with severe AS, HTN, HLD.   I evaluated patient at PAT due to possible swollen left submandibular lymph node. He reported that he woke up on 07/27/18 with point tenderness just under left jaw line (front of the mandibular angle). Denied recent illness, fever. He is aware of the area when he swallows, but otherwise does not report a sore throat. He had dental work (temporary crown) on the right side, but has not had any known teeth issues. There is no surrounding erythema. No erythema noted of the posterior pharynx. I did not palpate any significant submandibular lymph nodes--even at the isolated area of tenderness I did not feel a definitively enlarged lymph node or nodule or any significant asymmetry when compared to the right side. Given patient is scheduled for CABG with AVR. I did notify [facility] and Gershon Crane, PA-C who also evaluated patient and advised him to let their office know if he develops fever, redness, or increase size/tenderness of the area. WBC was WNL.  CXR is still in process. Based on currently available information I would anticipate that he can proceed as planned from an anesthesia standpoint.   VS: BP (!) 158/62   Pulse 63   Temp 36.4 C   Resp 18   Ht 5' 9.5" (1.765 m)   Wt 77.4 kg   SpO2 99%   BMI 24.85 kg/m   PROVIDERS: [name], MD is PCP Lance Muss, MD is cardiologist   LABS: Labs reviewed: Acceptable for  surgery. (all labs ordered are listed, but only abnormal results are displayed)  Labs Reviewed  BLOOD GAS, ARTERIAL - Abnormal; Notable for the following components:      Result Value   pO2, Arterial 63.6 (*)    Acid-Base Excess 2.2 (*)    All other components within normal limits  COMPREHENSIVE METABOLIC PANEL - Abnormal; Notable for the following components:   Glucose, Bld 105 (*)    All other components within normal limits  URINALYSIS, ROUTINE W REFLEX MICROSCOPIC - Abnormal; Notable for the following components:   Color, Urine STRAW (*)    All other components within normal limits  SURGICAL PCR SCREEN  APTT  CBC  HEMOGLOBIN A1C  PROTIME-INR  TYPE AND SCREEN  ABO/RH    IMAGES:  CXR 07/28/18: In Process.    EKG: 07/08/18: SR with first degree AV block. LAD. LBBB.   CV: Echo 07/28/18: Summary: Right Carotid: Velocities in the right ICA are consistent with a 1-39% stenosis. Left Carotid: Velocities in the left ICA are consistent with a 1-39% stenosis.  Cardiac cath 07/13/18:  Mid RCA stent is patent with mild in-stent restenosis  Prox RCA lesion is 30% stenosed.  Ost RPDA lesion is 25% stenosed.  Prox Cx lesion is 80% stenosed. This lesion appears unchanged from the 2010 cardiac cath.  Ost Cx to Prox Cx lesion is 50% stenosed.  Mid Cx to Dist Cx lesion is 90% stenosed. This lesion appears unchanged from the 2010 cardiac cath.  2nd Mrg lesion is 75% stenosed. Lat 2nd Mrg lesion is 75% stenosed. This bifurcation area in the OM represents new disease compared to 2010.  Prox LAD lesion is 25% stenosed.  Unable to cross aortic valve despite use of straight wire with AL-1 catheter.  Given the dramatic worsening of his symptoms, and the changes on his echocardiogram in terms of his aortic stenosis from 2018, I suspect his symptoms are coming more from aortic stenosis rather than coronary artery disease.  He has only had mild progression of his coronary disease over 10  years. - Will refer to surgery for aortic valve replacement with bypass to the circumflex territory.  Echo 07/12/18: IMPRESSIONS  1. The left ventricle has normal systolic function, with an ejection fraction of 55-60%. The cavity size was normal. There is mild asymmetric left ventricular hypertrophy of the septal wall. Left ventricular diastolic Doppler parameters are consistent  with impaired relaxation There is abnormal septal motion consistent with left bundle branch block. No evidence of left ventricular regional wall motion abnormalities.  2. Functionally bicuspid Severely thickening of the aortic valve Severe calcifcation of the aortic valve. Aortic valve regurgitation is mild by color flow Doppler. moderate-severe stenosis of the aortic valve.  3. Severely thickened, severely calcificed aortic valve that appears at least functionally bicuspid. Aortic stenosis is severe by valve area; while this may be due to difficulty measuring LVOT accurately, the dimensionless index also supports severe AS  (<0.25). Peak velocity 3.84 m/s, peak gradient 59 mmHg, mean gradient 29 mmHg are in moderate-severe range.  4. The right ventricle has normal systolic function. The cavity was normal. There is no increase in right ventricular wall thickness. Right ventricular systolic pressure could not be assessed.  5. Left atrial size was mildly dilated.  6. The mitral valve is normal in structure. There is moderate mitral annular calcification present.  7. The tricuspid valve is normal in structure.  8. The pulmonic valve was normal in structure.  9. There is mild dilatation of the aortic root measuring 37 mm. 10. No evidence of left ventricular regional wall motion abnormalities. 11. Right atrial pressure is estimated at 3 mmHg.   Past Medical History:  Diagnosis Date  . Chest pain   . Coronary artery disease   . Dyspnea   . Headache    migraines  . Hernia, abdominal    x 2  . History of kidney stones    . HTN (hypertension)   . Hyperlipidemia, mixed   . Post-nasal drip   . S/P right coronary artery (RCA) stent placement   . Severe aortic stenosis     Past Surgical History:  Procedure Laterality Date  . HERNIA REPAIR     B/L inguinal hernia  . RIGHT HEART CATH AND CORONARY ANGIOGRAPHY N/A 07/13/2018   Procedure: RIGHT HEART CATH AND CORONARY ANGIOGRAPHY;  Surgeon: Corky Crafts, MD;  Location: [facility] INVASIVE CV LAB;  Service: Cardiovascular;  Laterality: N/A;  . TONSILLECTOMY      MEDICATIONS: . oxymetazoline (AFRIN) 0.05 % nasal spray  . amoxicillin (AMOXIL) 500 MG capsule  . aspirin EC 81 MG tablet  . atorvastatin (LIPITOR) 40 MG tablet  . carvedilol (COREG) 3.125 MG tablet  . clopidogrel (PLAVIX) 75 MG tablet  . lisinopril (PRINIVIL,ZESTRIL) 20 MG tablet  . nitroGLYCERIN (NITROSTAT) 0.4 MG SL  tablet   No current facility-administered medications for this encounter.   Last Plavix 07/24/18.   Shonna Chock, PA-C Surgical Short Stay/Anesthesiology [facility] Phone [phone] [facility] Phone [phone] 07/28/2018 9:41 PM

## 2018-07-28 NOTE — Progress Notes (Signed)
Pt denies any acute cardiopulmonary issues. Pt stated that he is under the care of Dr Eldridge Dace, Cardiology.  Pt stated that last dose of Plavix was Saturday 07/24/2018 as instructed by MD. Pt stated that he was not instructed to stop taking Aspirin. Pt C/O tenderness on the left side of the neck at PAT appointment. PA,  Anesthesiology, asked to assess pt (see note). Pt verbalized understanding of all pre-op instructions.

## 2018-07-29 ENCOUNTER — Ambulatory Visit: Payer: Medicare Other | Admitting: Physician Assistant

## 2018-07-29 MED ORDER — DOPAMINE-DEXTROSE 3.2-5 MG/ML-% IV SOLN
0.0000 ug/kg/min | INTRAVENOUS | Status: DC
  Filled 2018-07-29: qty 250

## 2018-07-29 MED ORDER — PHENYLEPHRINE HCL-NACL 20-0.9 MG/250ML-% IV SOLN
30.0000 ug/min | INTRAVENOUS | Status: AC
  Administered 2018-07-30: 25 ug/min via INTRAVENOUS
  Filled 2018-07-29: qty 250

## 2018-07-29 MED ORDER — PLASMA-LYTE 148 IV SOLN
INTRAVENOUS | Status: AC
  Administered 2018-07-30: 500 mL
  Filled 2018-07-29: qty 2.5

## 2018-07-29 MED ORDER — DEXMEDETOMIDINE HCL IN NACL 400 MCG/100ML IV SOLN
0.1000 ug/kg/h | INTRAVENOUS | Status: AC
  Administered 2018-07-30: .4 ug/kg/h via INTRAVENOUS
  Filled 2018-07-29: qty 100

## 2018-07-29 MED ORDER — MILRINONE LACTATE IN DEXTROSE 20-5 MG/100ML-% IV SOLN
0.3000 ug/kg/min | INTRAVENOUS | Status: DC
  Filled 2018-07-29: qty 100

## 2018-07-29 MED ORDER — NOREPINEPHRINE BITARTRATE 1 MG/ML IV SOLN
0.0000 ug/min | INTRAVENOUS | Status: DC
  Filled 2018-07-29: qty 4

## 2018-07-29 MED ORDER — NITROGLYCERIN IN D5W 200-5 MCG/ML-% IV SOLN
2.0000 ug/min | INTRAVENOUS | Status: AC
  Administered 2018-07-30: 16.6 ug/min via INTRAVENOUS
  Filled 2018-07-29: qty 250

## 2018-07-29 MED ORDER — MAGNESIUM SULFATE 50 % IJ SOLN
40.0000 meq | INTRAMUSCULAR | Status: DC
  Filled 2018-07-29: qty 9.85

## 2018-07-29 MED ORDER — TRANEXAMIC ACID (OHS) BOLUS VIA INFUSION
15.0000 mg/kg | INTRAVENOUS | Status: AC
  Administered 2018-07-30: 1161 mg via INTRAVENOUS
  Filled 2018-07-29: qty 1161

## 2018-07-29 MED ORDER — EPINEPHRINE PF 1 MG/ML IJ SOLN
0.0000 ug/min | INTRAVENOUS | Status: DC
  Filled 2018-07-29: qty 4

## 2018-07-29 MED ORDER — SODIUM CHLORIDE 0.9 % IV SOLN
750.0000 mg | INTRAVENOUS | Status: AC
  Administered 2018-07-30: 750 mg via INTRAVENOUS
  Filled 2018-07-29: qty 750

## 2018-07-29 MED ORDER — POTASSIUM CHLORIDE 2 MEQ/ML IV SOLN
80.0000 meq | INTRAVENOUS | Status: DC
  Filled 2018-07-29: qty 40

## 2018-07-29 MED ORDER — TRANEXAMIC ACID (OHS) PUMP PRIME SOLUTION
2.0000 mg/kg | INTRAVENOUS | Status: DC
  Filled 2018-07-29: qty 1.55

## 2018-07-29 MED ORDER — TRANEXAMIC ACID 1000 MG/10ML IV SOLN
1.5000 mg/kg/h | INTRAVENOUS | Status: AC
  Administered 2018-07-30: 1.5 mg/kg/h via INTRAVENOUS
  Filled 2018-07-29: qty 25

## 2018-07-29 MED ORDER — INSULIN REGULAR(HUMAN) IN NACL 100-0.9 UT/100ML-% IV SOLN
INTRAVENOUS | Status: AC
  Administered 2018-07-30: 2.1 [IU]/h via INTRAVENOUS
  Filled 2018-07-29: qty 100

## 2018-07-29 MED ORDER — SODIUM CHLORIDE 0.9 % IV SOLN
INTRAVENOUS | Status: DC
  Filled 2018-07-29: qty 30

## 2018-07-29 MED ORDER — SODIUM CHLORIDE 0.9 % IV SOLN
1.5000 g | INTRAVENOUS | Status: AC
  Administered 2018-07-30: 1.5 g via INTRAVENOUS
  Filled 2018-07-29: qty 1.5

## 2018-07-29 MED ORDER — VANCOMYCIN HCL 10 G IV SOLR
1250.0000 mg | INTRAVENOUS | Status: AC
  Administered 2018-07-30: 1250 mg via INTRAVENOUS
  Filled 2018-07-29: qty 1250

## 2018-07-30 ENCOUNTER — Inpatient Hospital Stay (HOSPITAL_COMMUNITY)
Admission: RE | Admit: 2018-07-30 | Discharge: 2018-08-05 | DRG: 220 | Disposition: A | Discharge status: Home / Self Care | Payer: Medicare Other | Attending: Surgery | Admitting: Surgery

## 2018-07-30 ENCOUNTER — Inpatient Hospital Stay (HOSPITAL_COMMUNITY): Payer: Medicare Other | Admitting: Certified Registered"

## 2018-07-30 ENCOUNTER — Other Ambulatory Visit: Payer: Self-pay

## 2018-07-30 ENCOUNTER — Encounter (HOSPITAL_COMMUNITY): Payer: Self-pay | Admitting: *Deleted

## 2018-07-30 ENCOUNTER — Inpatient Hospital Stay (HOSPITAL_COMMUNITY): Payer: Medicare Other

## 2018-07-30 ENCOUNTER — Inpatient Hospital Stay (HOSPITAL_COMMUNITY): Payer: Medicare Other | Admitting: Vascular Surgery

## 2018-07-30 ENCOUNTER — Inpatient Hospital Stay (HOSPITAL_COMMUNITY)
Admission: RE | Disposition: A | Discharge status: Home / Self Care | Payer: Self-pay | Source: Home / Self Care | Attending: Surgery

## 2018-07-30 DIAGNOSIS — I350 Nonrheumatic aortic (valve) stenosis: Secondary | ICD-10-CM | POA: Diagnosis not present

## 2018-07-30 DIAGNOSIS — I10 Essential (primary) hypertension: Secondary | ICD-10-CM | POA: Diagnosis present

## 2018-07-30 DIAGNOSIS — Z7982 Long term (current) use of aspirin: Secondary | ICD-10-CM

## 2018-07-30 DIAGNOSIS — D62 Acute posthemorrhagic anemia: Secondary | ICD-10-CM | POA: Diagnosis not present

## 2018-07-30 DIAGNOSIS — Y831 Surgical operation with implant of artificial internal device as the cause of abnormal reaction of the patient, or of later complication, without mention of misadventure at the time of the procedure: Secondary | ICD-10-CM | POA: Diagnosis not present

## 2018-07-30 DIAGNOSIS — E782 Mixed hyperlipidemia: Secondary | ICD-10-CM | POA: Diagnosis present

## 2018-07-30 DIAGNOSIS — I2510 Atherosclerotic heart disease of native coronary artery without angina pectoris: Secondary | ICD-10-CM | POA: Diagnosis not present

## 2018-07-30 DIAGNOSIS — Z23 Encounter for immunization: Secondary | ICD-10-CM

## 2018-07-30 DIAGNOSIS — I4891 Unspecified atrial fibrillation: Secondary | ICD-10-CM | POA: Diagnosis not present

## 2018-07-30 DIAGNOSIS — Z79899 Other long term (current) drug therapy: Secondary | ICD-10-CM

## 2018-07-30 DIAGNOSIS — Z09 Encounter for follow-up examination after completed treatment for conditions other than malignant neoplasm: Secondary | ICD-10-CM

## 2018-07-30 DIAGNOSIS — Z8249 Family history of ischemic heart disease and other diseases of the circulatory system: Secondary | ICD-10-CM | POA: Diagnosis not present

## 2018-07-30 DIAGNOSIS — I447 Left bundle-branch block, unspecified: Secondary | ICD-10-CM | POA: Diagnosis present

## 2018-07-30 DIAGNOSIS — I440 Atrioventricular block, first degree: Secondary | ICD-10-CM | POA: Diagnosis not present

## 2018-07-30 DIAGNOSIS — T82855A Stenosis of coronary artery stent, initial encounter: Secondary | ICD-10-CM | POA: Diagnosis present

## 2018-07-30 DIAGNOSIS — I25119 Atherosclerotic heart disease of native coronary artery with unspecified angina pectoris: Secondary | ICD-10-CM | POA: Diagnosis present

## 2018-07-30 DIAGNOSIS — Z823 Family history of stroke: Secondary | ICD-10-CM | POA: Diagnosis not present

## 2018-07-30 DIAGNOSIS — Z7902 Long term (current) use of antithrombotics/antiplatelets: Secondary | ICD-10-CM

## 2018-07-30 DIAGNOSIS — Z419 Encounter for procedure for purposes other than remedying health state, unspecified: Secondary | ICD-10-CM

## 2018-07-30 DIAGNOSIS — Z952 Presence of prosthetic heart valve: Secondary | ICD-10-CM

## 2018-07-30 DIAGNOSIS — I77810 Thoracic aortic ectasia: Secondary | ICD-10-CM | POA: Diagnosis present

## 2018-07-30 DIAGNOSIS — J9811 Atelectasis: Secondary | ICD-10-CM | POA: Diagnosis not present

## 2018-07-30 DIAGNOSIS — Z8349 Family history of other endocrine, nutritional and metabolic diseases: Secondary | ICD-10-CM | POA: Diagnosis not present

## 2018-07-30 DIAGNOSIS — Z87891 Personal history of nicotine dependence: Secondary | ICD-10-CM

## 2018-07-30 DIAGNOSIS — Z951 Presence of aortocoronary bypass graft: Secondary | ICD-10-CM

## 2018-07-30 DIAGNOSIS — I083 Combined rheumatic disorders of mitral, aortic and tricuspid valves: Secondary | ICD-10-CM | POA: Diagnosis present

## 2018-07-30 HISTORY — PX: CORONARY ARTERY BYPASS GRAFT: SHX141

## 2018-07-30 HISTORY — PX: TEE WITHOUT CARDIOVERSION: SHX5443

## 2018-07-30 HISTORY — PX: AORTIC VALVE REPLACEMENT: SHX41

## 2018-07-30 LAB — POCT I-STAT 7, (LYTES, BLD GAS, ICA,H+H)
Acid-base deficit: 2 mmol/L (ref 0.0–2.0)
Acid-base deficit: 2 mmol/L (ref 0.0–2.0)
Acid-base deficit: 4 mmol/L — ABNORMAL HIGH (ref 0.0–2.0)
Bicarbonate: 23.1 mmol/L (ref 20.0–28.0)
Bicarbonate: 22.8 mmol/L (ref 20.0–28.0)
Bicarbonate: 21.8 mmol/L (ref 20.0–28.0)
Calcium, Ion: 1.14 mmol/L — ABNORMAL LOW (ref 1.15–1.40)
Calcium, Ion: 1.1 mmol/L — ABNORMAL LOW (ref 1.15–1.40)
Calcium, Ion: 1.17 mmol/L (ref 1.15–1.40)
HCT: 24 % — ABNORMAL LOW (ref 39.0–52.0)
HCT: 31 % — ABNORMAL LOW (ref 39.0–52.0)
HCT: 32 % — ABNORMAL LOW (ref 39.0–52.0)
Hemoglobin: 8.2 g/dL — ABNORMAL LOW (ref 13.0–17.0)
Hemoglobin: 10.5 g/dL — ABNORMAL LOW (ref 13.0–17.0)
Hemoglobin: 10.9 g/dL — ABNORMAL LOW (ref 13.0–17.0)
O2 Saturation: 97 %
O2 Saturation: 99 %
O2 Saturation: 98 %
POTASSIUM: 4.5 mmol/L (ref 3.5–5.1)
Patient temperature: 36.5
Patient temperature: 36.6
Potassium: 3.9 mmol/L (ref 3.5–5.1)
Potassium: 4.6 mmol/L (ref 3.5–5.1)
Sodium: 143 mmol/L (ref 135–145)
Sodium: 140 mmol/L (ref 135–145)
Sodium: 139 mmol/L (ref 135–145)
TCO2: 24 mmol/L (ref 22–32)
TCO2: 24 mmol/L (ref 22–32)
TCO2: 23 mmol/L (ref 22–32)
pCO2 arterial: 37.2 mmHg (ref 32.0–48.0)
pCO2 arterial: 38.9 mmHg (ref 32.0–48.0)
pCO2 arterial: 41.9 mmHg (ref 32.0–48.0)
pH, Arterial: 7.394 (ref 7.350–7.450)
pH, Arterial: 7.374 (ref 7.350–7.450)
pH, Arterial: 7.323 — ABNORMAL LOW (ref 7.350–7.450)
pO2, Arterial: 90 mmHg (ref 83.0–108.0)
pO2, Arterial: 145 mmHg — ABNORMAL HIGH (ref 83.0–108.0)
pO2, Arterial: 115 mmHg — ABNORMAL HIGH (ref 83.0–108.0)

## 2018-07-30 LAB — ECHO INTRAOPERATIVE TEE
Height: 69.5 in
WEIGHTICAEL: 2731.2 [oz_av]

## 2018-07-30 LAB — BASIC METABOLIC PANEL
Anion gap: 4 — ABNORMAL LOW (ref 5–15)
BUN: 12 mg/dL (ref 8–23)
CALCIUM: 7.7 mg/dL — AB (ref 8.9–10.3)
CO2: 23 mmol/L (ref 22–32)
CREATININE: 1.01 mg/dL (ref 0.61–1.24)
Chloride: 111 mmol/L (ref 98–111)
GFR calc Af Amer: >60 mL/min (ref >60)
GFR calc non Af Amer: >60 mL/min (ref >60)
Glucose, Bld: 146 mg/dL — ABNORMAL HIGH (ref 70–99)
Potassium: 4.5 mmol/L (ref 3.5–5.1)
Sodium: 138 mmol/L (ref 135–145)

## 2018-07-30 LAB — HEMOGLOBIN AND HEMATOCRIT, BLOOD
HCT: 31.7 % — ABNORMAL LOW (ref 39.0–52.0)
Hemoglobin: 10.4 g/dL — ABNORMAL LOW (ref 13.0–17.0)

## 2018-07-30 LAB — CBC
HCT: 34.6 % — ABNORMAL LOW (ref 39.0–52.0)
HEMATOCRIT: 30.8 % — AB (ref 39.0–52.0)
Hemoglobin: 10.3 g/dL — ABNORMAL LOW (ref 13.0–17.0)
Hemoglobin: 11.2 g/dL — ABNORMAL LOW (ref 13.0–17.0)
MCH: 30.7 pg (ref 26.0–34.0)
MCH: 29.8 pg (ref 26.0–34.0)
MCHC: 33.4 g/dL (ref 30.0–36.0)
MCHC: 32.4 g/dL (ref 30.0–36.0)
MCV: 91.7 fL (ref 80.0–100.0)
MCV: 92 fL (ref 80.0–100.0)
Platelets: 123 10*3/uL — ABNORMAL LOW (ref 150–400)
Platelets: 137 10*3/uL — ABNORMAL LOW (ref 150–400)
RBC: 3.36 MIL/uL — ABNORMAL LOW (ref 4.22–5.81)
RBC: 3.76 MIL/uL — ABNORMAL LOW (ref 4.22–5.81)
RDW: 13.2 % (ref 11.5–15.5)
RDW: 13.2 % (ref 11.5–15.5)
WBC: 15.6 10*3/uL — ABNORMAL HIGH (ref 4.0–10.5)
WBC: 16.3 10*3/uL — ABNORMAL HIGH (ref 4.0–10.5)
nRBC: 0 % (ref 0.0–0.2)
nRBC: 0 % (ref 0.0–0.2)

## 2018-07-30 LAB — GLUCOSE, CAPILLARY
Glucose-Capillary: 98 mg/dL (ref 70–99)
Glucose-Capillary: 98 mg/dL (ref 70–99)
Glucose-Capillary: 144 mg/dL — ABNORMAL HIGH (ref 70–99)
Glucose-Capillary: 122 mg/dL — ABNORMAL HIGH (ref 70–99)
Glucose-Capillary: 140 mg/dL — ABNORMAL HIGH (ref 70–99)
Glucose-Capillary: 141 mg/dL — ABNORMAL HIGH (ref 70–99)
Glucose-Capillary: 138 mg/dL — ABNORMAL HIGH (ref 70–99)
Glucose-Capillary: 151 mg/dL — ABNORMAL HIGH (ref 70–99)
Glucose-Capillary: 127 mg/dL — ABNORMAL HIGH (ref 70–99)
Glucose-Capillary: 114 mg/dL — ABNORMAL HIGH (ref 70–99)

## 2018-07-30 LAB — POCT I-STAT 4, (NA,K, GLUC, HGB,HCT)
Glucose, Bld: 101 mg/dL — ABNORMAL HIGH (ref 70–99)
HCT: 26 % — ABNORMAL LOW (ref 39.0–52.0)
Hemoglobin: 8.8 g/dL — ABNORMAL LOW (ref 13.0–17.0)
POTASSIUM: 3.9 mmol/L (ref 3.5–5.1)
Sodium: 142 mmol/L (ref 135–145)

## 2018-07-30 LAB — MAGNESIUM: Magnesium: 3.3 mg/dL — ABNORMAL HIGH (ref 1.7–2.4)

## 2018-07-30 LAB — APTT: aPTT: 41 seconds — ABNORMAL HIGH (ref 24–36)

## 2018-07-30 LAB — PROTIME-INR
INR: 1.7 — ABNORMAL HIGH (ref 0.8–1.2)
Prothrombin Time: 19.8 seconds — ABNORMAL HIGH (ref 11.4–15.2)

## 2018-07-30 LAB — PLATELET COUNT: PLATELETS: 180 10*3/uL (ref 150–400)

## 2018-07-30 SURGERY — CORONARY ARTERY BYPASS GRAFTING (CABG)
Anesthesia: General | Site: Chest

## 2018-07-30 MED ORDER — BISACODYL 5 MG PO TBEC
10.0000 mg | DELAYED_RELEASE_TABLET | Freq: Every day | ORAL | Status: DC
  Administered 2018-07-31 – 2018-08-02 (×3): 10 mg via ORAL
  Filled 2018-07-30 (×3): qty 2

## 2018-07-30 MED ORDER — DEXMEDETOMIDINE HCL IN NACL 200 MCG/50ML IV SOLN
0.0000 ug/kg/h | INTRAVENOUS | Status: DC
  Filled 2018-07-30: qty 50

## 2018-07-30 MED ORDER — INSULIN REGULAR BOLUS VIA INFUSION
0.0000 [IU] | Freq: Three times a day (TID) | INTRAVENOUS | Status: DC
  Filled 2018-07-30: qty 10

## 2018-07-30 MED ORDER — PHENYLEPHRINE HCL-NACL 20-0.9 MG/250ML-% IV SOLN: 0.0000 ug/min | INTRAVENOUS | Status: DC

## 2018-07-30 MED ORDER — DOCUSATE SODIUM 100 MG PO CAPS
200.0000 mg | ORAL_CAPSULE | Freq: Every day | ORAL | Status: DC
  Administered 2018-07-31 – 2018-08-02 (×3): 200 mg via ORAL
  Filled 2018-07-30 (×5): qty 2

## 2018-07-30 MED ORDER — CHLORHEXIDINE GLUCONATE 0.12 % MT SOLN
15.0000 mL | OROMUCOSAL | Status: AC
  Administered 2018-07-30: 15 mL via OROMUCOSAL

## 2018-07-30 MED ORDER — SODIUM CHLORIDE 0.9% FLUSH
3.0000 mL | Freq: Two times a day (BID) | INTRAVENOUS | Status: DC
  Administered 2018-07-31: 3 mL via INTRAVENOUS
  Administered 2018-07-31: 10 mL via INTRAVENOUS
  Administered 2018-08-01 – 2018-08-04 (×8): 3 mL via INTRAVENOUS

## 2018-07-30 MED ORDER — SUFENTANIL CITRATE 50 MCG/ML IV SOLN
INTRAVENOUS | Status: DC | PRN
  Administered 2018-07-30 (×2): 20 ug via INTRAVENOUS
  Administered 2018-07-30 (×2): 30 ug via INTRAVENOUS
  Administered 2018-07-30: 10 ug via INTRAVENOUS
  Administered 2018-07-30 (×3): 20 ug via INTRAVENOUS

## 2018-07-30 MED ORDER — ALBUMIN HUMAN 5 % IV SOLN: 250.0000 mL | INTRAVENOUS | Status: AC | PRN

## 2018-07-30 MED ORDER — ASPIRIN 81 MG PO CHEW: 324.0000 mg | CHEWABLE_TABLET | Freq: Every day | ORAL | Status: DC

## 2018-07-30 MED ORDER — ASPIRIN EC 325 MG PO TBEC
325.0000 mg | DELAYED_RELEASE_TABLET | Freq: Every day | ORAL | Status: DC
  Administered 2018-07-31 – 2018-08-05 (×6): 325 mg via ORAL
  Filled 2018-07-30 (×6): qty 1

## 2018-07-30 MED ORDER — CHLORHEXIDINE GLUCONATE 0.12 % MT SOLN
15.0000 mL | Freq: Once | OROMUCOSAL | Status: AC
  Administered 2018-07-30: 15 mL via OROMUCOSAL
  Filled 2018-07-30: qty 15

## 2018-07-30 MED ORDER — MIDAZOLAM HCL (PF) 10 MG/2ML IJ SOLN
INTRAMUSCULAR | Status: AC
  Filled 2018-07-30: qty 2

## 2018-07-30 MED ORDER — METOPROLOL TARTRATE 25 MG/10 ML ORAL SUSPENSION: 12.5000 mg | Freq: Two times a day (BID) | ORAL | Status: DC

## 2018-07-30 MED ORDER — SODIUM CHLORIDE 0.9 % IR SOLN
Status: DC | PRN
  Administered 2018-07-30: 6000 mL

## 2018-07-30 MED ORDER — ACETAMINOPHEN 160 MG/5ML PO SOLN: 650.0000 mg | Freq: Once | ORAL | Status: AC

## 2018-07-30 MED ORDER — SODIUM CHLORIDE 0.9 % IV SOLN: INTRAVENOUS | Status: DC

## 2018-07-30 MED ORDER — ONDANSETRON HCL 4 MG/2ML IJ SOLN
4.0000 mg | Freq: Four times a day (QID) | INTRAMUSCULAR | Status: DC | PRN
  Administered 2018-07-30 – 2018-08-01 (×3): 4 mg via INTRAVENOUS
  Filled 2018-07-30 (×3): qty 2

## 2018-07-30 MED ORDER — MORPHINE SULFATE (PF) 2 MG/ML IV SOLN
1.0000 mg | INTRAVENOUS | Status: DC | PRN
  Administered 2018-07-30 – 2018-07-31 (×3): 2 mg via INTRAVENOUS
  Filled 2018-07-30 (×3): qty 1

## 2018-07-30 MED ORDER — EPHEDRINE SULFATE-NACL 50-0.9 MG/10ML-% IV SOSY
PREFILLED_SYRINGE | INTRAVENOUS | Status: DC | PRN
  Administered 2018-07-30: 10 mg via INTRAVENOUS

## 2018-07-30 MED ORDER — LACTATED RINGERS IV SOLN
INTRAVENOUS | Status: DC
  Administered 2018-07-31: 20 mL/h via INTRAVENOUS

## 2018-07-30 MED ORDER — METOPROLOL TARTRATE 12.5 MG HALF TABLET
12.5000 mg | ORAL_TABLET | Freq: Two times a day (BID) | ORAL | Status: DC
  Administered 2018-07-31 – 2018-08-02 (×4): 12.5 mg via ORAL
  Filled 2018-07-30 (×4): qty 1

## 2018-07-30 MED ORDER — HEMOSTATIC AGENTS (NO CHARGE) OPTIME
TOPICAL | Status: DC | PRN
  Administered 2018-07-30 (×2): 1 via TOPICAL

## 2018-07-30 MED ORDER — ARTIFICIAL TEARS OPHTHALMIC OINT
TOPICAL_OINTMENT | OPHTHALMIC | Status: AC
  Filled 2018-07-30: qty 3.5

## 2018-07-30 MED ORDER — LACTATED RINGERS IV SOLN
INTRAVENOUS | Status: DC | PRN
  Administered 2018-07-30 (×2): via INTRAVENOUS

## 2018-07-30 MED ORDER — ALBUMIN HUMAN 5 % IV SOLN
INTRAVENOUS | Status: DC | PRN
  Administered 2018-07-30: 12:00:00 via INTRAVENOUS

## 2018-07-30 MED ORDER — METOPROLOL TARTRATE 12.5 MG HALF TABLET: 12.5000 mg | ORAL_TABLET | Freq: Once | ORAL | Status: DC

## 2018-07-30 MED ORDER — ONDANSETRON HCL 4 MG/2ML IJ SOLN
INTRAMUSCULAR | Status: AC
  Filled 2018-07-30: qty 2

## 2018-07-30 MED ORDER — VECURONIUM BROMIDE 10 MG IV SOLR
INTRAVENOUS | Status: DC | PRN
  Administered 2018-07-30 (×4): 5 mg via INTRAVENOUS

## 2018-07-30 MED ORDER — BISACODYL 10 MG RE SUPP: 10.0000 mg | Freq: Every day | RECTAL | Status: DC

## 2018-07-30 MED ORDER — VECURONIUM BROMIDE 10 MG IV SOLR
INTRAVENOUS | Status: AC
  Filled 2018-07-30: qty 10

## 2018-07-30 MED ORDER — ONDANSETRON HCL 4 MG/2ML IJ SOLN
INTRAMUSCULAR | Status: DC | PRN
  Administered 2018-07-30: 4 mg via INTRAVENOUS

## 2018-07-30 MED ORDER — SUFENTANIL CITRATE 250 MCG/5ML IV SOLN
INTRAVENOUS | Status: AC
  Filled 2018-07-30: qty 5

## 2018-07-30 MED ORDER — HEPARIN SODIUM (PORCINE) 1000 UNIT/ML IJ SOLN
INTRAMUSCULAR | Status: AC
  Filled 2018-07-30: qty 1

## 2018-07-30 MED ORDER — PROTAMINE SULFATE 10 MG/ML IV SOLN
INTRAVENOUS | Status: DC | PRN
  Administered 2018-07-30: 270 mg via INTRAVENOUS

## 2018-07-30 MED ORDER — SODIUM CHLORIDE 0.45 % IV SOLN: INTRAVENOUS | Status: DC | PRN

## 2018-07-30 MED ORDER — CHLORHEXIDINE GLUCONATE 4 % EX LIQD: 30.0000 mL | CUTANEOUS | Status: DC

## 2018-07-30 MED ORDER — SODIUM CHLORIDE (PF) 0.9 % IJ SOLN
OROMUCOSAL | Status: DC | PRN
  Administered 2018-07-30: 1 mL via TOPICAL

## 2018-07-30 MED ORDER — PROPOFOL 10 MG/ML IV BOLUS
INTRAVENOUS | Status: DC | PRN
  Administered 2018-07-30 (×2): 70 mg via INTRAVENOUS
  Administered 2018-07-30: 60 mg via INTRAVENOUS

## 2018-07-30 MED ORDER — SUCCINYLCHOLINE CHLORIDE 200 MG/10ML IV SOSY
PREFILLED_SYRINGE | INTRAVENOUS | Status: AC
  Filled 2018-07-30: qty 10

## 2018-07-30 MED ORDER — LACTATED RINGERS IV SOLN: 500.0000 mL | Freq: Once | INTRAVENOUS | Status: DC | PRN

## 2018-07-30 MED ORDER — OXYCODONE HCL 5 MG PO TABS
5.0000 mg | ORAL_TABLET | ORAL | Status: DC | PRN
  Administered 2018-07-30: 5 mg via ORAL
  Administered 2018-07-31 – 2018-08-02 (×3): 10 mg via ORAL
  Filled 2018-07-30 (×4): qty 2
  Filled 2018-07-30: qty 1
  Filled 2018-07-30: qty 2

## 2018-07-30 MED ORDER — PROTAMINE SULFATE 10 MG/ML IV SOLN
INTRAVENOUS | Status: AC
  Filled 2018-07-30: qty 5

## 2018-07-30 MED ORDER — ARTIFICIAL TEARS OPHTHALMIC OINT
TOPICAL_OINTMENT | OPHTHALMIC | Status: DC | PRN
  Administered 2018-07-30: 1 via OPHTHALMIC

## 2018-07-30 MED ORDER — SODIUM CHLORIDE 0.9 % IV SOLN
1.5000 g | Freq: Two times a day (BID) | INTRAVENOUS | Status: AC
  Administered 2018-07-30 – 2018-08-01 (×4): 1.5 g via INTRAVENOUS
  Filled 2018-07-30 (×4): qty 1.5

## 2018-07-30 MED ORDER — HEPARIN SODIUM (PORCINE) 1000 UNIT/ML IJ SOLN
INTRAMUSCULAR | Status: DC | PRN
  Administered 2018-07-30: 27000 [IU] via INTRAVENOUS

## 2018-07-30 MED ORDER — ATORVASTATIN CALCIUM 40 MG PO TABS
40.0000 mg | ORAL_TABLET | Freq: Every day | ORAL | Status: DC
  Administered 2018-07-31 – 2018-08-05 (×6): 40 mg via ORAL
  Filled 2018-07-30 (×6): qty 1

## 2018-07-30 MED ORDER — SODIUM CHLORIDE 0.9 % IV SOLN: 250.0000 mL | INTRAVENOUS | Status: DC

## 2018-07-30 MED ORDER — SUCCINYLCHOLINE CHLORIDE 20 MG/ML IJ SOLN
INTRAMUSCULAR | Status: DC | PRN
  Administered 2018-07-30: 100 mg via INTRAVENOUS

## 2018-07-30 MED ORDER — MAGNESIUM SULFATE 4 GM/100ML IV SOLN
4.0000 g | Freq: Once | INTRAVENOUS | Status: AC
  Administered 2018-07-30: 4 g via INTRAVENOUS
  Filled 2018-07-30: qty 100

## 2018-07-30 MED ORDER — PANTOPRAZOLE SODIUM 40 MG PO TBEC
40.0000 mg | DELAYED_RELEASE_TABLET | Freq: Every day | ORAL | Status: DC
  Administered 2018-08-01 – 2018-08-04 (×4): 40 mg via ORAL
  Filled 2018-07-30 (×5): qty 1

## 2018-07-30 MED ORDER — VANCOMYCIN HCL IN DEXTROSE 1-5 GM/200ML-% IV SOLN
1000.0000 mg | Freq: Once | INTRAVENOUS | Status: AC
  Administered 2018-07-30: 1000 mg via INTRAVENOUS
  Filled 2018-07-30: qty 200

## 2018-07-30 MED ORDER — THROMBIN 20000 UNITS EX SOLR
CUTANEOUS | Status: DC | PRN
  Administered 2018-07-30: 20000 [IU] via TOPICAL

## 2018-07-30 MED ORDER — PROTAMINE SULFATE 10 MG/ML IV SOLN
INTRAVENOUS | Status: AC
  Filled 2018-07-30: qty 25

## 2018-07-30 MED ORDER — POTASSIUM CHLORIDE 10 MEQ/50ML IV SOLN
10.0000 meq | INTRAVENOUS | Status: AC
  Administered 2018-07-30 (×3): 10 meq via INTRAVENOUS

## 2018-07-30 MED ORDER — SODIUM CHLORIDE 0.9% FLUSH: 3.0000 mL | INTRAVENOUS | Status: DC | PRN

## 2018-07-30 MED ORDER — ACETAMINOPHEN 650 MG RE SUPP
650.0000 mg | Freq: Once | RECTAL | Status: AC
  Administered 2018-07-30: 650 mg via RECTAL

## 2018-07-30 MED ORDER — NITROGLYCERIN IN D5W 200-5 MCG/ML-% IV SOLN: 0.0000 ug/min | INTRAVENOUS | Status: DC

## 2018-07-30 MED ORDER — MIDAZOLAM HCL 2 MG/2ML IJ SOLN: 2.0000 mg | INTRAMUSCULAR | Status: DC | PRN

## 2018-07-30 MED ORDER — TRAMADOL HCL 50 MG PO TABS
50.0000 mg | ORAL_TABLET | ORAL | Status: DC | PRN
  Administered 2018-07-31 – 2018-08-01 (×3): 100 mg via ORAL
  Filled 2018-07-30 (×3): qty 2

## 2018-07-30 MED ORDER — MIDAZOLAM HCL 5 MG/5ML IJ SOLN
INTRAMUSCULAR | Status: DC | PRN
  Administered 2018-07-30 (×2): 3 mg via INTRAVENOUS
  Administered 2018-07-30 (×2): 2 mg via INTRAVENOUS

## 2018-07-30 MED ORDER — METOPROLOL TARTRATE 5 MG/5ML IV SOLN: 2.5000 mg | INTRAVENOUS | Status: DC | PRN

## 2018-07-30 MED ORDER — ACETAMINOPHEN 160 MG/5ML PO SOLN: 1000.0000 mg | Freq: Four times a day (QID) | ORAL | Status: DC

## 2018-07-30 MED ORDER — ACETAMINOPHEN 500 MG PO TABS
1000.0000 mg | ORAL_TABLET | Freq: Four times a day (QID) | ORAL | Status: AC
  Administered 2018-07-31 – 2018-08-04 (×11): 1000 mg via ORAL
  Filled 2018-07-30 (×13): qty 2

## 2018-07-30 MED ORDER — THROMBIN (RECOMBINANT) 20000 UNITS EX SOLR
CUTANEOUS | Status: AC
  Filled 2018-07-30: qty 20000

## 2018-07-30 MED ORDER — INSULIN REGULAR(HUMAN) IN NACL 100-0.9 UT/100ML-% IV SOLN: INTRAVENOUS | Status: DC

## 2018-07-30 MED ORDER — FAMOTIDINE IN NACL 20-0.9 MG/50ML-% IV SOLN
20.0000 mg | Freq: Two times a day (BID) | INTRAVENOUS | Status: AC
  Administered 2018-07-30 (×2): 20 mg via INTRAVENOUS
  Filled 2018-07-30: qty 50

## 2018-07-30 MED ORDER — PROPOFOL 10 MG/ML IV BOLUS
INTRAVENOUS | Status: AC
  Filled 2018-07-30: qty 20

## 2018-07-30 MED ORDER — LACTATED RINGERS IV SOLN
INTRAVENOUS | Status: DC
  Administered 2018-07-30: 13:00:00 via INTRAVENOUS

## 2018-07-30 SURGICAL SUPPLY — 119 items
ADAPTER CARDIO PERF ANTE/RETRO (ADAPTER) ×4 IMPLANT
BAG DECANTER FOR FLEXI CONT (MISCELLANEOUS) ×4 IMPLANT
BANDAGE ACE 4X5 VEL STRL LF (GAUZE/BANDAGES/DRESSINGS) ×4 IMPLANT
BANDAGE ACE 6X5 VEL STRL LF (GAUZE/BANDAGES/DRESSINGS) ×4 IMPLANT
BANDAGE ELASTIC 4 VELCRO ST LF (GAUZE/BANDAGES/DRESSINGS) ×4 IMPLANT
BANDAGE ELASTIC 6 VELCRO ST LF (GAUZE/BANDAGES/DRESSINGS) ×4 IMPLANT
BASKET HEART  (ORDER IN 25'S) (MISCELLANEOUS) ×1
BASKET HEART (ORDER IN 25'S) (MISCELLANEOUS) ×1
BASKET HEART (ORDER IN 25S) (MISCELLANEOUS) ×2 IMPLANT
BLADE STERNUM SYSTEM 6 (BLADE) ×4 IMPLANT
BLADE SURG 15 STRL LF DISP TIS (BLADE) ×2 IMPLANT
BLADE SURG 15 STRL SS (BLADE) ×2
BNDG GAUZE ELAST 4 BULKY (GAUZE/BANDAGES/DRESSINGS) ×4 IMPLANT
CANISTER SUCT 3000ML PPV (MISCELLANEOUS) ×4 IMPLANT
CANNULA GUNDRY RCSP 15FR (MISCELLANEOUS) ×4 IMPLANT
CATH HEART VENT LEFT (CATHETERS) ×2 IMPLANT
CATH ROBINSON RED A/P 18FR (CATHETERS) ×12 IMPLANT
CATH THORACIC 28FR (CATHETERS) ×4 IMPLANT
CATH THORACIC 36FR (CATHETERS) ×4 IMPLANT
CATH THORACIC 36FR RT ANG (CATHETERS) ×4 IMPLANT
CLIP VESOCCLUDE MED 24/CT (CLIP) IMPLANT
CLIP VESOCCLUDE SM WIDE 24/CT (CLIP) IMPLANT
CONT SPEC 4OZ CLIKSEAL STRL BL (MISCELLANEOUS) ×4 IMPLANT
COVER MAYO STAND STRL (DRAPES) ×4 IMPLANT
COVER SURGICAL LIGHT HANDLE (MISCELLANEOUS) ×4 IMPLANT
COVER WAND RF STERILE (DRAPES) IMPLANT
CRADLE DONUT ADULT HEAD (MISCELLANEOUS) ×4 IMPLANT
DERMABOND ADVANCED (GAUZE/BANDAGES/DRESSINGS) ×2
DERMABOND ADVANCED .7 DNX12 (GAUZE/BANDAGES/DRESSINGS) ×2 IMPLANT
DRAPE CARDIOVASCULAR INCISE (DRAPES) ×2
DRAPE SLUSH/WARMER DISC (DRAPES) ×4 IMPLANT
DRAPE SRG 135X102X78XABS (DRAPES) ×2 IMPLANT
DRSG COVADERM 4X14 (GAUZE/BANDAGES/DRESSINGS) ×4 IMPLANT
ELECT CAUTERY BLADE 6.4 (BLADE) ×4 IMPLANT
ELECT REM PT RETURN 9FT ADLT (ELECTROSURGICAL) ×8
ELECTRODE REM PT RTRN 9FT ADLT (ELECTROSURGICAL) ×4 IMPLANT
FELT TEFLON 1X6 (MISCELLANEOUS) ×8 IMPLANT
GAUZE SPONGE 4X4 12PLY STRL (GAUZE/BANDAGES/DRESSINGS) ×8 IMPLANT
GAUZE SPONGE 4X4 12PLY STRL LF (GAUZE/BANDAGES/DRESSINGS) ×8 IMPLANT
GLOVE BIO SURGEON STRL SZ 6 (GLOVE) ×16 IMPLANT
GLOVE BIO SURGEON STRL SZ 6.5 (GLOVE) ×21 IMPLANT
GLOVE BIO SURGEON STRL SZ7 (GLOVE) IMPLANT
GLOVE BIO SURGEON STRL SZ7.5 (GLOVE) ×8 IMPLANT
GLOVE BIO SURGEONS STRL SZ 6.5 (GLOVE) ×7
GLOVE BIOGEL PI IND STRL 6 (GLOVE) IMPLANT
GLOVE BIOGEL PI IND STRL 6.5 (GLOVE) IMPLANT
GLOVE BIOGEL PI IND STRL 7.0 (GLOVE) IMPLANT
GLOVE BIOGEL PI INDICATOR 6 (GLOVE)
GLOVE BIOGEL PI INDICATOR 6.5 (GLOVE)
GLOVE BIOGEL PI INDICATOR 7.0 (GLOVE)
GLOVE EUDERMIC 7 POWDERFREE (GLOVE) ×4 IMPLANT
GLOVE ORTHO TXT STRL SZ7.5 (GLOVE) IMPLANT
GOWN STRL REUS W/ TWL LRG LVL3 (GOWN DISPOSABLE) ×20 IMPLANT
GOWN STRL REUS W/ TWL XL LVL3 (GOWN DISPOSABLE) ×2 IMPLANT
GOWN STRL REUS W/TWL LRG LVL3 (GOWN DISPOSABLE) ×20
GOWN STRL REUS W/TWL XL LVL3 (GOWN DISPOSABLE) ×2
HEMOSTAT POWDER SURGIFOAM 1G (HEMOSTASIS) ×12 IMPLANT
HEMOSTAT SURGICEL 2X14 (HEMOSTASIS) ×4 IMPLANT
INSERT FOGARTY 61MM (MISCELLANEOUS) IMPLANT
INSERT FOGARTY XLG (MISCELLANEOUS) IMPLANT
KIT BASIN OR (CUSTOM PROCEDURE TRAY) ×4 IMPLANT
KIT CATH CPB BARTLE (MISCELLANEOUS) ×4 IMPLANT
KIT SUCTION CATH 14FR (SUCTIONS) ×4 IMPLANT
KIT TURNOVER KIT B (KITS) ×4 IMPLANT
KIT VASOVIEW HEMOPRO 2 VH 4000 (KITS) ×4 IMPLANT
LINE VENT (MISCELLANEOUS) ×4 IMPLANT
NS IRRIG 1000ML POUR BTL (IV SOLUTION) ×24 IMPLANT
PACK E OPEN HEART (SUTURE) ×4 IMPLANT
PACK OPEN HEART (CUSTOM PROCEDURE TRAY) ×4 IMPLANT
PAD ARMBOARD 7.5X6 YLW CONV (MISCELLANEOUS) ×8 IMPLANT
PAD ELECT DEFIB RADIOL ZOLL (MISCELLANEOUS) ×4 IMPLANT
PENCIL BUTTON HOLSTER BLD 10FT (ELECTRODE) ×4 IMPLANT
PUNCH AORTIC ROTATE 4.0MM (MISCELLANEOUS) IMPLANT
PUNCH AORTIC ROTATE 4.5MM 8IN (MISCELLANEOUS) ×4 IMPLANT
PUNCH AORTIC ROTATE 5MM 8IN (MISCELLANEOUS) IMPLANT
SEALANT SURG COSEAL 4ML (VASCULAR PRODUCTS) ×4 IMPLANT
SET CARDIOPLEGIA MPS 5001102 (MISCELLANEOUS) ×4 IMPLANT
SPONGE INTESTINAL PEANUT (DISPOSABLE) IMPLANT
SPONGE LAP 18X18 RF (DISPOSABLE) ×4 IMPLANT
SPONGE LAP 4X18 RFD (DISPOSABLE) ×4 IMPLANT
SUT BONE WAX W31G (SUTURE) ×4 IMPLANT
SUT ETHIBON 2 0 V 52N 30 (SUTURE) ×8 IMPLANT
SUT MNCRL AB 4-0 PS2 18 (SUTURE) IMPLANT
SUT PROLENE 3 0 SH DA (SUTURE) IMPLANT
SUT PROLENE 3 0 SH1 36 (SUTURE) ×4 IMPLANT
SUT PROLENE 4 0 RB 1 (SUTURE) ×6
SUT PROLENE 4 0 SH DA (SUTURE) IMPLANT
SUT PROLENE 4-0 RB1 .5 CRCL 36 (SUTURE) ×6 IMPLANT
SUT PROLENE 5 0 C 1 36 (SUTURE) IMPLANT
SUT PROLENE 6 0 C 1 30 (SUTURE) IMPLANT
SUT PROLENE 7 0 BV 1 (SUTURE) IMPLANT
SUT PROLENE 7 0 BV1 MDA (SUTURE) ×8 IMPLANT
SUT PROLENE 8 0 BV175 6 (SUTURE) IMPLANT
SUT SILK  1 MH (SUTURE)
SUT SILK 1 MH (SUTURE) IMPLANT
SUT SILK 2 0 SH (SUTURE) IMPLANT
SUT STEEL 6MS V (SUTURE) ×8 IMPLANT
SUT STEEL STERNAL CCS#1 18IN (SUTURE) IMPLANT
SUT STEEL SZ 6 DBL 3X14 BALL (SUTURE) IMPLANT
SUT VIC AB 1 CTX 36 (SUTURE) ×4
SUT VIC AB 1 CTX36XBRD ANBCTR (SUTURE) ×4 IMPLANT
SUT VIC AB 2-0 CT1 27 (SUTURE) ×2
SUT VIC AB 2-0 CT1 TAPERPNT 27 (SUTURE) ×2 IMPLANT
SUT VIC AB 2-0 CTX 27 (SUTURE) IMPLANT
SUT VIC AB 3-0 SH 27 (SUTURE)
SUT VIC AB 3-0 SH 27X BRD (SUTURE) IMPLANT
SUT VIC AB 3-0 X1 27 (SUTURE) ×4 IMPLANT
SUT VICRYL 4-0 PS2 18IN ABS (SUTURE) IMPLANT
SYSTEM SAHARA CHEST DRAIN ATS (WOUND CARE) ×4 IMPLANT
TAPE CLOTH SURG 4X10 WHT LF (GAUZE/BANDAGES/DRESSINGS) ×8 IMPLANT
TAPE PAPER 3X10 WHT MICROPORE (GAUZE/BANDAGES/DRESSINGS) ×4 IMPLANT
TOWEL GREEN STERILE (TOWEL DISPOSABLE) ×4 IMPLANT
TOWEL GREEN STERILE FF (TOWEL DISPOSABLE) ×4 IMPLANT
TRAY FOLEY SLVR 16FR TEMP STAT (SET/KITS/TRAYS/PACK) ×4 IMPLANT
TUBING LAP HI FLOW INSUFFLATIO (TUBING) ×4 IMPLANT
UNDERPAD 30X30 (UNDERPADS AND DIAPERS) ×4 IMPLANT
VALVE AORTIC SZ25 INSP/RESIL (Prosthesis & Implant Heart) ×4 IMPLANT
VENT LEFT HEART 12002 (CATHETERS) ×4
WATER STERILE IRR 1000ML POUR (IV SOLUTION) ×8 IMPLANT

## 2018-07-30 NOTE — Anesthesia Procedure Notes (Signed)
Central Venous Catheter Insertion Performed by: Roderic Palau, MD, anesthesiologist Start/End3/10/2018 7:20 AM, 07/30/2018 7:30 AM Patient location: Pre-op. Preanesthetic checklist: patient identified, IV checked, site marked, risks and benefits discussed, surgical consent, monitors and equipment checked, pre-op evaluation, timeout performed and anesthesia consent Position: Trendelenburg Lidocaine 1% used for infiltration and patient sedated Hand hygiene performed , maximum sterile barriers used  and Seldinger technique used Catheter size: 9 Fr Total catheter length 10. Central line was placed.MAC introducer Swan type:thermodilution Procedure performed using ultrasound guided technique. Ultrasound Notes:anatomy identified, needle tip was noted to be adjacent to the nerve/plexus identified, no ultrasound evidence of intravascular and/or intraneural injection and image(s) printed for medical record Attempts: 1 Following insertion, line sutured, dressing applied and Biopatch. Post procedure assessment: blood return through all ports, free fluid flow and no air  Patient tolerated the procedure well with no immediate complications.

## 2018-07-30 NOTE — Transfer of Care (Signed)
Immediate Anesthesia Transfer of Care Note  Patient: Joseph Morse  Procedure(s) Performed: CORONARY ARTERY BYPASS GRAFTING (CABG)x 2  , using left internal mammary artery and right leg greater saphenous  vein harvested endoscopically (N/A Chest) AORTIC VALVE REPLACEMENT (AVR) (N/A Chest) TRANSESOPHAGEAL ECHOCARDIOGRAM (TEE) (N/A )  Patient Location: ICU  Anesthesia Type:General  Level of Consciousness: Patient remains intubated per anesthesia plan  Airway & Oxygen Therapy: Patient remains intubated per anesthesia plan and Patient placed on Ventilator (see vital sign flow sheet for setting)  Post-op Assessment: Report given to RN and Post -op Vital signs reviewed and stable  Post vital signs: Reviewed and stable  Last Vitals:  Vitals Value Taken Time  BP    Temp 36.1 C 07/30/2018  1:21 PM  Pulse 89 07/30/2018  1:20 PM  Resp 14 07/30/2018  1:21 PM  SpO2 96 % 07/30/2018  1:20 PM  Vitals shown include unvalidated device data.  Last Pain:  Vitals:   07/30/18 0553  TempSrc:   PainSc: 0-No pain      Patients Stated Pain Goal: 4 (07/30/18 0553)  Complications: No apparent anesthesia complications

## 2018-07-30 NOTE — Interval H&P Note (Signed)
History and Physical Interval Note:  07/30/2018 5:44 AM  Joseph Morse  has presented today for surgery, with the diagnosis of CAD SEVERE AS  The various methods of treatment have been discussed with the patient and family. After consideration of risks, benefits and other options for treatment, the patient has consented to  Procedure(s): CORONARY ARTERY BYPASS GRAFTING (CABG) (N/A) AORTIC VALVE REPLACEMENT (AVR) (N/A) TRANSESOPHAGEAL ECHOCARDIOGRAM (TEE) (N/A) as a surgical intervention .  The patient's history has been reviewed, patient examined, no change in status, stable for surgery.  I have reviewed the patient's chart and labs.  Questions were answered to the patient's satisfaction.     Alleen Borne

## 2018-07-30 NOTE — Progress Notes (Signed)
[facility] BRIEF SICU PROGRESS NOTE  Day of Surgery  S/P Procedure(s) (LRB): CORONARY ARTERY BYPASS GRAFTING (CABG)x 2  , using left internal mammary artery and right leg greater saphenous  vein harvested endoscopically (N/A) AORTIC VALVE REPLACEMENT (AVR) (N/A) TRANSESOPHAGEAL ECHOCARDIOGRAM (TEE) (N/A)   Extubated uneventfully AV paced w/ stable hemodynamics, no drips Breathing comfortably w/ O2 sats 100% Chest tube output low UOP adequate Labs okay  Plan: Continue routine early postop  Purcell Nails, MD 07/30/2018 6:41 PM

## 2018-07-30 NOTE — Anesthesia Procedure Notes (Signed)
Central Venous Catheter Insertion Performed by: Gaynelle Adu, MD, anesthesiologist Start/End3/10/2018 7:20 AM, 07/30/2018 7:30 AM Patient location: Pre-op. Preanesthetic checklist: patient identified, IV checked, site marked, risks and benefits discussed, surgical consent, monitors and equipment checked, pre-op evaluation, timeout performed and anesthesia consent Hand hygiene performed  and maximum sterile barriers used  PA cath was placed.Swan type:thermodilution PA Cath depth:50 Procedure performed without using ultrasound guided technique. Attempts: 1 Patient tolerated the procedure well with no immediate complications.

## 2018-07-30 NOTE — Op Note (Signed)
CARDIOVASCULAR SURGERY OPERATIVE NOTE  07/30/2018  Surgeon:  Alleen Borne, MD  First Assistant: Gershon Crane,  PA-C   Preoperative Diagnosis:  Severe multi-vessel coronary artery disease, severe aortic stenosis and moderate aortic insufficiency.   Postoperative Diagnosis:  Same   Procedure:  1. Median Sternotomy 2. Extracorporeal circulation 3.   Coronary artery bypass grafting x 2   Sequential SVG to OM 1 and OM2    4.   Endoscopic vein harvest from the right leg 5.   Aortic valve replacement using a 25 mm Edwards INSPIRIS RESILIA pericardial valve.   Anesthesia:  General Endotracheal   Clinical History/Surgical Indication:  The patient is a 68 year old gentleman with a history of hypertension, hyperlipidemia, bicuspid aortic valve disease with moderate aortic stenosis, and coronary artery disease status post drug-eluting stent placement in the right coronary artery in the past. His echocardiogram in 03/2017 showed a bicuspid aortic valve with calcified leaflets and restricted mobility. The mean gradient was 22 mmHg with a peak gradient 34 mmHg and a dimensionless index of 0.14. The valve area was measured at that time as0.45 cm. This was felt to represent moderate aortic stenosis. The left ventricular ejection fraction was 60 to 65% with grade 1 diastolic dysfunction. He recently presented with a several month history of exertional substernal chest discomfort that he describes as a burning sensation as well as shortness of breath occurring with exertion and relieved with rest. He used to be able to walk several miles without any difficulty and now has to stop multiple times to rest over the past few months. He has also noticed some chest pressure and shortness of breath with walking up stairs. He has had some dizziness and felt like he may pass out a couple times. A repeat  echocardiogram on 07/12/2018 showed a severely thickened and calcified aortic valve that appeared functionally bicuspid. The mean gradient was measured at 29 mmHg with a peak velocity of 3.84 m/s. Dimensionless index was less than 0.25 with a calculated aortic valve area of 0.81 cm. Left ventricular ejection fraction remains normal at 55 to 60%. There is mild dilation of the aortic root measured at 37 mm. Cardiac catheterization was performed on 07/13/2018. This showed the drug-eluting stents in the mid right coronary artery patent with mild in-stent restenosis. The LAD had no significant stenosis. Left circumflex had 80% stenosis at the bifurcation of a large marginal branch which was unchanged from prior catheterization in 2010. It was not possible to cross the aortic valve with an AL-1 catheter. Right heart pressures were within normal limits.  He has stage D, severe, symptomatic aortic stenosis with New York Heart Association class II-III symptoms of exertional fatigue and shortness of breath associated with exertional chest discomfort and dizziness consistent with chronic diastolic congestive heart failure. Cardiac catheterization shows 80% proximal left circumflex stenosis compromising a large marginal branch. His symptoms sound like they are more likely related to progressive aortic stenosis. I reviewed the cardiac catheterization films and the echocardiogram images with the patient and his wife and answered their questions. I think the best option is to proceed with coronary artery bypass graft surgery to the left circumflex system and aortic valve replacement using a bioprosthetic pericardial valve. I discussed the operative procedure with the patient andhis wifeincluding alternatives, benefits and risks; including but not limited to bleeding, blood transfusion, infection, stroke, myocardial infarction, graft failure, heart block requiring a permanent pacemaker, organ dysfunction, and  death. Joseph Arciero Hackettunderstands and agrees to proceed.  Preparation:  The patient was seen in the preoperative holding area and the correct patient, correct operation were confirmed with the patient after reviewing the medical record and catheterization. The consent was signed by me. Preoperative antibiotics were given. A pulmonary arterial line and radial arterial line were placed by the anesthesia team. The patient was taken back to the operating room and positioned supine on the operating room table. After being placed under general endotracheal anesthesia by the anesthesia team a foley catheter was placed. The neck, chest, abdomen, and both legs were prepped with betadine soap and solution and draped in the usual sterile manner. A surgical time-out was taken and the correct patient and operative procedure were confirmed with the nursing and anesthesia staff.  TEE: performed by Dr. Marguerita Merles. This showed a bicuspid aortic valve that was severely calcified and severely stenotic with at least moderate AI. LV systolic function was normal with moderate concentric LVH. No significant MR. Small PFO with left to right flow.  Cardiopulmonary Bypass:  A median sternotomy was performed. The pericardium was opened in the midline. Right ventricular function appeared normal. The ascending aorta was of normal size and had no palpable plaque. There were no contraindications to aortic cannulation or cross-clamping. The patient was fully systemically heparinized and the ACT was maintained > 400 sec. The proximal aortic arch was cannulated with a 20 F aortic cannula for arterial inflow. Venous cannulation was performed via the right atrial appendage using a two-staged venous cannula. An antegrade cardioplegia/vent cannula was inserted into the mid-ascending aorta. Aortic occlusion was performed with a single cross-clamp. Systemic cooling to 32 degrees Centigrade and topical cooling of the heart with iced  saline were used. Hyperkalemic antegrade cold blood cardioplegia was used to induce diastolic arrest and was then given at about 20 minute intervals throughout the period of arrest to maintain myocardial temperature at or below 10 degrees centigrade. A temperature probe was inserted into the interventricular septum and an insulating pad was placed in the pericardium.   Endoscopic vein harvest:  The right greater saphenous vein was harvested endoscopically through a 2 cm incision medial to the right knee. It was harvested from the upper thigh to below the knee. It was a medium-sized vein of good quality. The side branches were all ligated with 4-0 silk ties.    Coronary arteries:  The coronary arteries were examined.   LAD:  Diffusely diseased with calcific plaque. No significant stenosis on cath.  LCX:  OM 1 and OM2 were moderate caliber vessels with minimal distal disease. Distal LCX marginal was small and diffusely diseased and not graftable.   RCA:  Diffusely diseased with calcific plaque with stents throughout the proximal to mid vessel. No significant stenosis on cath.    Grafts:   1. SVG to OM1:  1.6 mm. It was sewn sequential side to side using 7-0 prolene continuous suture. 2. SVG to OM2:  1.6 mm. It was sewn sequential end to side using 7-0 prolene continuous suture.   The proximal vein graft anastomosis was performed to the mid-ascending aorta using continuous 6-0 prolene suture. A graft marker was placed around the proximal anastomosis.  Aortic Valve Replacement:   A transverse aortotomy was performed 1 cm above the take-off of the right coronary artery. The native valve was a type 1 bicuspid with a single raphe and fusion of the left and right cusps.  There were severely calcified and immobile leaflets and severe annular calcification. The ostia of the left  main coronary was low and very close to the annulus at the left/non commissure. It was widely patent and quite large.  The right coronary ostia was in normal position and not obstructed. The native valve leaflets were excised and the annulus was decalcified with rongeurs. Care was taken to remove all particulate debris. The left ventricle was directly inspected for debris and then irrigated with ice saline solution. The annulus was sized and a size 25mm Edwards INSPIRIS RESILIA pericardial valve was chosen. The model number was [id number] and the serial number was [id number].  2-0 Ethibond pledgeted horizontal mattress sutures were placed around the annulus with the pledgets in a sub-annular position. The sutures were placed through the sewing ring and the valve lowered into place. The sutures were tied sequentially. The valve seated nicely and the coronary ostia were not obstructed. The prosthetic valve leaflets moved normally and there was no sub-valvular obstruction. The aortotomy was closed using 4-0 Prolene suture in 2 layers with felt strips to reinforce the closure.  Completion:  The patient was rewarmed to 37 degrees Centigrade. The left heart was de-aired. The crossclamp was removed with a time of 137 minutes. There was spontaneous return of sinus rhythm. The distal and proximal anastomoses were checked for hemostasis. The position of the graft was satisfactory. Two temporary epicardial pacing wires were placed on the right atrium and two on the right ventricle. The patient was weaned from CPB without difficulty on no inotropes. CPB time was 161 minutes. Cardiac output was 5 L/min. TEE showed a normally functioning prosthetic valve with no AI or paravalvular leak. LV systolic function was normal. There was no MR. Heparin was fully reversed with protamine and the aortic and venous cannulas removed. Hemostasis was achieved. Mediastinal and left pleural drainage tubes were placed. The sternum was closed with #6 stainless steel wires. The fascia was closed with continuous # 1 vicryl suture. The subcutaneous tissue was closed with  2-0 vicryl continuous suture. The skin was closed with 3-0 vicryl subcuticular suture. All sponge, needle, and instrument counts were reported correct at the end of the case. Dry sterile dressings were placed over the incisions and around the chest tubes which were connected to pleurevac suction. The patient was then transported to the surgical intensive care unit in stable condition.

## 2018-07-30 NOTE — Anesthesia Postprocedure Evaluation (Signed)
Anesthesia Post Note  Patient: Joseph Morse  Procedure(s) Performed: CORONARY ARTERY BYPASS GRAFTING (CABG)x 2  , using left internal mammary artery and right leg greater saphenous  vein harvested endoscopically (N/A Chest) AORTIC VALVE REPLACEMENT (AVR) (N/A Chest) TRANSESOPHAGEAL ECHOCARDIOGRAM (TEE) (N/A )     Patient location during evaluation: SICU Anesthesia Type: General Level of consciousness: sedated Pain management: pain level controlled Vital Signs Assessment: post-procedure vital signs reviewed and stable Respiratory status: patient remains intubated per anesthesia plan Cardiovascular status: stable Postop Assessment: no apparent nausea or vomiting Anesthetic complications: no    Last Vitals:  Vitals:   07/30/18 1315 07/30/18 1400  BP:  101/73  Pulse: 90 90  Resp: 12 12  Temp:  (!) 35.4 C  SpO2: 94% 97%    Last Pain:  Vitals:   07/30/18 0553  TempSrc:   PainSc: 0-No pain                 Ceaira Ernster,W. EDMOND

## 2018-07-30 NOTE — Brief Op Note (Signed)
07/30/2018  10:11 AM  PATIENT:  Felicity Pellegrini  68 y.o. male  PRE-OPERATIVE DIAGNOSIS:  CAD SEVERE AS  POST-OPERATIVE DIAGNOSIS:  CAD SEVERE AS  PROCEDURE:  Procedure(s): CORONARY ARTERY BYPASS GRAFTING (CABG)x 2  , using left internal mammary artery and right leg greater saphenous  vein harvested endoscopically (N/A) AORTIC VALVE REPLACEMENT (AVR) (N/A) TRANSESOPHAGEAL ECHOCARDIOGRAM (TEE) (N/A) SEQ SVG-OM1-OM2  SURGEON:  Surgeon(s) and Role:    * Bartle, Payton Doughty, MD - Primary  PHYSICIAN ASSISTANT: WAYNE GOLD PA-C  ANESTHESIA:   general  EBL:  800 mL   BLOOD ADMINISTERED:none  DRAINS: PLEURAL AND PERICARDIAL CHEST TUBES   LOCAL MEDICATIONS USED:  NONE  SPECIMEN:  Source of Specimen:  AORTIC VALVE LEAFLETS  DISPOSITION OF SPECIMEN:  PATHOLOGY  COUNTS:  YES  TOURNIQUET:  * No tourniquets in log *  DICTATION: .Dragon Dictation  PLAN OF CARE: Admit to inpatient   PATIENT DISPOSITION:  ICU - intubated and hemodynamically stable.   Delay start of Pharmacological VTE agent (>24hrs) due to surgical blood loss or risk of bleeding: yes  COMPLICATIONS: NO KNOWN

## 2018-07-30 NOTE — Progress Notes (Signed)
  Echocardiogram Echocardiogram Transesophageal has been performed.  Celene Skeen 07/30/2018, 8:56 AM

## 2018-07-30 NOTE — Anesthesia Procedure Notes (Signed)
Arterial Line Insertion Start/End3/10/2018 6:50 AM, 07/30/2018 7:00 AM Performed by: Shireen Quan, CRNA, CRNA  Patient location: Pre-op. Preanesthetic checklist: patient identified, IV checked, site marked, risks and benefits discussed, surgical consent, monitors and equipment checked, pre-op evaluation, timeout performed and anesthesia consent Lidocaine 1% used for infiltration and patient sedated Left, radial was placed Catheter size: 20 G Hand hygiene performed , maximum sterile barriers used  and Seldinger technique used Allen's test indicative of satisfactory collateral circulation Attempts: 1 Procedure performed without using ultrasound guided technique. Following insertion, dressing applied and Biopatch. Post procedure assessment: normal  Patient tolerated the procedure well with no immediate complications.

## 2018-07-30 NOTE — Procedures (Signed)
Extubation Procedure Note  Patient Details:   Name: Joseph Morse DOB: 09/01/1950 MRN: [id number]   Airway Documentation:   + cuff leak test prior to extubation Vent end date: 07/30/18 Vent end time: 1650   Evaluation  O2 sats: stable throughout Complications: No apparent complications Patient did tolerate procedure well. Bilateral Breath Sounds: Clear, Diminished   Yes pt able to speak.  No distress noted, no stridor noted.  VC 1.4L, NIF -[address]  Jennette Kettle 07/30/2018, 4:57 PM

## 2018-07-30 NOTE — H&P (Signed)
[address]       Joseph Morse [id number]             [phone]      Cardiothoracic Surgery Admission History and Physical   PCP is [name], MD Referring Provider is Corky Crafts, MD      Chief Complaint  Patient presents with  . 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  His echocardiogram in 03/2017 showed a bicuspid aortic valve with calcified leaflets and restricted mobility.  The mean gradient was 22 mmHg with a peak gradient 34 mmHg and a dimensionless index of 0.14.  The valve area was measured at that time as 0.45 cm.  This was felt to represent moderate aortic stenosis.  The left ventricular ejection fraction was 60 to 65% with grade 1 diastolic dysfunction.  He recently presented with a several month history of exertional substernal chest discomfort that he describes as a burning sensation as well as shortness of breath occurring with exertion and relieved with rest.  He used to be able to walk several miles without any difficulty and now has to stop multiple times to rest over the past few months.  He has also noticed some chest pressure and shortness of breath with walking up stairs.  He has had some dizziness and felt like he may pass out a couple times.  A repeat echocardiogram on 07/12/2018 showed a severely thickened and calcified aortic valve that appeared functionally bicuspid.  The mean gradient was measured at 29 mmHg with a peak velocity of 3.84 m/s.  Dimensionless index was less than 0.25 with a calculated aortic valve area of 0.81 cm.  Left ventricular ejection fraction remains normal at 55 to 60%.  There is mild dilation of the aortic root measured at 37  mm.  Cardiac catheterization was performed on 07/13/2018.  This showed the drug-eluting stents in the mid right coronary artery patent with mild in-stent restenosis.  The LAD had no significant stenosis.  Left circumflex had 80% stenosis at the bifurcation of a large marginal branch which was unchanged from prior catheterization in 2010.  It was not possible to cross the aortic valve with an AL-1 catheter.  Right heart pressures were within normal limits.       Past Medical History:  Diagnosis Date  . Chest pain   . HTN (hypertension)   . Hyperlipidemia, mixed   . S/P right coronary artery (RCA) stent placement          Past Surgical History:  Procedure Laterality Date  . RIGHT HEART CATH AND CORONARY ANGIOGRAPHY N/A 07/13/2018   Procedure: RIGHT HEART CATH AND CORONARY ANGIOGRAPHY;  Surgeon: Corky Crafts, MD;  Location: [facility] INVASIVE CV LAB;  Service: Cardiovascular;  Laterality: N/A;         Family History  Problem Relation Age of Onset  . Heart attack Father   . CVA Mother   . Hyperlipidemia Sister   . Cancer Maternal Grandfather     Social History Social History   Tobacco Use  . Smoking status: Former Games developer  . Smokeless tobacco: Never Used  Substance Use Topics  . Alcohol use: No    Frequency: Never  .  Drug use: No          Current Outpatient Medications  Medication Sig Dispense Refill  . aspirin EC 81 MG tablet Take 81 mg by mouth daily.    Marland Kitchen atorvastatin (LIPITOR) 40 MG tablet TAKE 1 TABLET BY MOUTH DAILY (Patient taking differently: Take 40 mg by mouth daily. TAKE 1 TABLET BY MOUTH DAILY) 90 tablet 3  . carvedilol (COREG) 3.125 MG tablet Take 1 tablet (3.125 mg total) by mouth 2 (two) times daily. 180 tablet 3  . clopidogrel (PLAVIX) 75 MG tablet Take 1 tablet (75 mg total) by mouth daily. 90 tablet 3  . lisinopril (PRINIVIL,ZESTRIL) 20 MG tablet Take 1 tablet (20 mg total) by mouth daily. 90 tablet 3  . nitroGLYCERIN (NITROSTAT) 0.4  MG SL tablet Place 1 tablet (0.4 mg total) under the tongue every 5 (five) minutes as needed. 25 tablet 3  . amoxicillin (AMOXIL) 500 MG capsule Take 4 capsules 1 hour prior to dental procedures 4 capsule 3   No current facility-administered medications for this visit.     No Known Allergies  Review of Systems  Constitutional: Positive for activity change and fatigue.  HENT: Negative.  Negative for dental problem.        Sees a dentist regularly  Eyes: Negative.   Respiratory: Positive for shortness of breath.   Cardiovascular: Positive for chest pain. Negative for palpitations and leg swelling.  Gastrointestinal: Negative.   Endocrine: Negative.   Genitourinary: Negative.   Musculoskeletal: Negative.   Allergic/Immunologic: Negative.   Neurological: Positive for dizziness and light-headedness. Negative for syncope.  Hematological: Negative.   Psychiatric/Behavioral: Negative.     BP (!) 143/75 (BP Location: Right Arm, Patient Position: Sitting, Cuff Size: Large)   Pulse 67   Resp 18   Ht 5' 9.5" (1.765 m)   Wt 174 lb 3.2 oz (79 kg)   SpO2 98% Comment: RA  BMI 25.36 kg/m  Physical Exam Constitutional:      Appearance: Normal appearance. He is normal weight.  HENT:     Head: Normocephalic and atraumatic.     Mouth/Throat:     Mouth: Mucous membranes are moist.     Pharynx: Oropharynx is clear.     Comments: Teeth in good condition Eyes:     Extraocular Movements: Extraocular movements intact.     Conjunctiva/sclera: Conjunctivae normal.     Pupils: Pupils are equal, round, and reactive to light.  Neck:     Musculoskeletal: Normal range of motion and neck supple.  Cardiovascular:     Rate and Rhythm: Normal rate and regular rhythm.     Pulses: Normal pulses.     Heart sounds: Murmur present.     Comments: 3/6 systolic murmur along the right sternal border Pulmonary:     Effort: Pulmonary effort is normal.     Breath sounds: Normal breath sounds.  Abdominal:      General: Abdomen is flat. Bowel sounds are normal.     Palpations: Abdomen is soft.     Tenderness: There is no abdominal tenderness.  Musculoskeletal: Normal range of motion.        General: No swelling.  Skin:    General: Skin is warm and dry.  Neurological:     General: No focal deficit present.     Mental Status: He is alert.  Psychiatric:        Mood and Affect: Mood normal.        Behavior: Behavior normal.  Thought Content: Thought content normal.        Judgment: Judgment normal.     Diagnostic Tests:  ECHOCARDIOGRAM REPORT     Patient Name: Joseph Morse Date of Exam: 07/12/2018 Medical Rec #: [id number] Height: 69.5 in Accession #: [id number] Weight: 170.2 lb Date of Birth: July 15, 1950 BSA: 1.94 m Patient Age: 73 years BP: 150/76 mmHg Patient Gender: M HR: 64 bpm. Exam Location: [address]   Procedure: 2D Echo, 3D Echo, Cardiac Doppler and Color Doppler  Indications: R06.09 Dyspnea on exertion  History: Patient has prior history of Echocardiogram examinations, most recent 04/23/2017. Aortic stenosis; Signs/Symptoms: Dyspnea and Dilated aortic root.  Sonographer: Samule Ohm RDCS Referring Phys: [id number] Joseph Morse Diagnosing Phys: Jodelle Red MD  IMPRESSIONS   1. The left ventricle has normal systolic function, with an ejection fraction of 55-60%. The cavity size was normal. There is mild asymmetric left ventricular hypertrophy of the septal wall. Left ventricular diastolic Doppler parameters are consistent  with impaired relaxation There is abnormal septal motion consistent with left bundle branch block. No evidence of left ventricular regional wall motion abnormalities. 2. Functionally bicuspid Severely thickening of the aortic valve Severe calcifcation of the  aortic valve. Aortic valve regurgitation is mild by color flow Doppler. moderate-severe stenosis of the aortic valve. 3. Severely thickened, severely calcificed aortic valve that appears at least functionally bicuspid. Aortic stenosis is severe by valve area; while this may be due to difficulty measuring LVOT accurately, the dimensionless index also supports severe AS  (<0.25). Peak velocity 3.84 m/s, peak gradient 59 mmHg, mean gradient 29 mmHg are in moderate-severe range. 4. The right ventricle has normal systolic function. The cavity was normal. There is no increase in right ventricular wall thickness. Right ventricular systolic pressure could not be assessed. 5. Left atrial size was mildly dilated. 6. The mitral valve is normal in structure. There is moderate mitral annular calcification present. 7. The tricuspid valve is normal in structure. 8. The pulmonic valve was normal in structure. 9. There is mild dilatation of the aortic root measuring 37 mm. 10. No evidence of left ventricular regional wall motion abnormalities. 11. Right atrial pressure is estimated at 3 mmHg.  FINDINGS Left Ventricle: The left ventricle has normal systolic function, with an ejection fraction of 55-60%. The cavity size was normal. There is mild asymmetric left ventricular hypertrophy of the septal wall. Left ventricular diastolic Doppler parameters are consistent with impaired relaxation There is abnormal (paradoxical) septal motion, consistent with left bundle branch block. No evidence of left ventricular regional wall motion abnormalities.. Right Ventricle: The right ventricle has normal systolic function. The cavity was normal. There is no increase in right ventricular wall thickness. Right ventricular systolic pressure could not be assessed.    Left Atrium: left atrial size was mildly dilated Right Atrium: right atrial size was normal in size. Right atrial pressure is estimated at 3 mmHg. Right  atrial pressure is estimated at 3 mmHg. Interatrial Septum: No atrial level shunt detected by color flow Doppler. Pericardium: There is no evidence of pericardial effusion. Mitral Valve: The mitral valve is normal in structure. There is moderate mitral annular calcification present. Mitral valve regurgitation is trivial by color flow Doppler. Tricuspid Valve: The tricuspid valve is normal in structure. Tricuspid valve regurgitation is trivial by color flow Doppler. Aortic Valve: functionally bicuspid Severely thickening of the aortic valve, Severe calcifcation of the aortic valve and, with moderately decreased cusp excursion. Aortic valve regurgitation is mild by color flow Doppler.  There is moderate-severe  stenosis of the aortic valve, with a calculated valve area of 0.81 cm. Pulmonic Valve: The pulmonic valve was normal in structure. Pulmonic valve regurgitation is not visualized by color flow Doppler. Aorta: There is mild dilatation of the aortic root measuring 37 mm. Venous: The inferior vena cava measures 1.10 cm, is normal in size with greater than 50% respiratory variability.  LEFT VENTRICLE PLAX 2D (Teich) LV EF: 54.9 % Diastology LVIDd: 4.90 cm LV e' lateral: 6.31 cm/s LVIDs: 3.50 cm LV E/e' lateral: 12.6 LV PW: 1.30 cm LV e' medial: 4.13 cm/s LV IVS: 1.70 cm LV E/e' medial: 19.3 LVOT diam: 2.16 cm LV SV: 62 ml LVOT Area: 3.66 cm 3D Volume EF LV 3D EF: 55.00 % LV 3D EDV: 255.00 mm LV 3D ESV: 115.00 mm LV 3D SV: 140.00 mm  RIGHT VENTRICLE RV S prime: 7.18 cm/s TAPSE (M-mode): 1.5 cm  LEFT ATRIUM Index RIGHT ATRIUM Index LA diam: 3.80 cm 1.96 cm/m RA Pressure: 3 mmHg LA Vol (A2C): 81.6 ml 42.09 ml/m RA Area: 11.50  cm LA Vol (A4C): 51.1 ml 26.36 ml/m RA Volume: 23.10 ml 11.92 ml/m LA Biplane Vol: 70.7 ml 36.47 ml/m AORTIC VALVE AV Area (Vmax): 0.73 cm AV Area (Vmean): 0.73 cm AV Area (VTI): 0.81 cm AV Vmax: 384.00 cm/s AV Vmean: 275.000 cm/s AV VTI: 0.899 m AV Peak Grad: 59.0 mmHg AV Mean Grad: 29.3 mmHg LVOT Vmax: 77.00 cm/s LVOT Vmean: 55.100 cm/s LVOT VTI: 0.200 m LVOT/AV VTI ratio: 0.22 AR PHT: 394 msec  AORTA Ao Root diam: 3.20 cm Ao Asc diam: 3.70 cm  MITRAL VALVE MV Area (PHT): MV PHT: MV Decel Time: 240 msec MV E velocity: 79.70 cm/s MV A velocity: 94.70 cm/s MV E/A ratio: 0.84  IVC IVC diam: 1.10 cm   Jodelle Red MD Electronically signed by Jodelle Red MD Signature Date/Time: 07/12/2018/12:39:15 PM   Physicians   Panel Physicians Referring Physician Case Authorizing Physician  Corky Crafts, MD (Primary)    Procedures   RIGHT HEART CATH AND CORONARY ANGIOGRAPHY  Conclusion     Mid RCA stent is patent with mild in-stent restenosis  Prox RCA lesion is 30% stenosed.  Ost RPDA lesion is 25% stenosed.  Prox Cx lesion is 80% stenosed. This lesion appears unchanged from the 2010 cardiac cath.  Ost Cx to Prox Cx lesion is 50% stenosed.  Mid Cx to Dist Cx lesion is 90% stenosed. This lesion appears unchanged from the 2010 cardiac cath.  2nd Mrg lesion is 75% stenosed. Lat 2nd Mrg lesion is 75% stenosed. This bifurcation area in the OM represents new disease compared to 2010.  Prox LAD lesion is 25% stenosed.  Unable to cross aortic valve despite use of straight wire with AL-1 catheter.  Given the dramatic worsening of his symptoms, and the changes on his echocardiogram in terms of his aortic stenosis from 2018, I suspect his symptoms are coming more from aortic stenosis rather than coronary artery disease. He has only had  mild progression of his coronary disease over 10 years.  Will refer to surgery for aortic valve replacement with bypass to the circumflex territory.  He will need to avoid any strenuous activities given the presyncope that he has felt, as he walks up hills. Continue Plavix for now. Once he sees the surgeon and a date is set for surgery, they can hold the Plavix 5 days prior to surgery, at that point.   Indications   Nonrheumatic aortic valve stenosis [  I35.0 (ICD-10-CM)]  Angina pectoris (HCC) [I20.9 (ICD-10-CM)]  Procedural Details   Technical Details The risks, benefits, and details of the procedure were explained to the patient. The patient verbalized understanding and wanted to proceed. Informed written consent was obtained.  PROCEDURE TECHNIQUE: After Xylocaine anesthesia, a 5 French sheath was placed in the right antecubital area and exchanged for a peripheral IV. A 5 French balloontipped Swan-Ganz catheter was advanced to the pulmonary artery under fluoroscopic guidance. Hemodynamic pressures were obtained. Oxygen saturations were obtained. After Xylocaine anesthesia, a 15F sheath was placed in the right radial artery with a single anterior needle wall stick using ultrasound guidance. An image was captured and stored. Left coronary angiography was done using a Judkins L3.5 guide catheter. Right coronary angiography was done using a Judkins R4 guide catheter. Left heart cath was attempted using a JR4, AL-1 catheter and straight wire. We were unable to cross the aortic valve.    Contrast: 75 cc  Estimated blood loss <50 mL.   During this procedure medications were administered to achieve and maintain moderate conscious sedation while the patient's heart rate, blood pressure, and oxygen saturation were continuously monitored and I was present face-to-face 100% of this time.  Medications  (Filter: Administrations occurring from 07/13/18 0721 to 07/13/18 0834)          Medication  Rate/Dose/Volume Action  Date Time   Heparin (Porcine) in NaCl 1000-0.9 UT/500ML-% SOLN (mL) 500 mL Given 07/13/18 0728   Total dose as of 07/19/18 1240        500 mL        Heparin (Porcine) in NaCl 1000-0.9 UT/500ML-% SOLN (mL) 500 mL Given 07/13/18 0728   Total dose as of 07/19/18 1240        500 mL        fentaNYL (SUBLIMAZE) injection (mcg) 25 mcg Given 07/13/18 0732   Total dose as of 07/19/18 1240 25 mcg Given 0810   50 mcg        midazolam (VERSED) injection (mg) 2 mg Given 07/13/18 0732   Total dose as of 07/19/18 1240 1 mg Given 0810   3 mg        lidocaine (PF) (XYLOCAINE) 1 % injection (mL) 1 mL Given 07/13/18 0744   Total dose as of 07/19/18 1240 2 mL Given 0745   3 mL        Radial Cocktail/Verapamil only (mL) 10 mL Given 07/13/18 0749   Total dose as of 07/19/18 1240        10 mL        heparin injection (Units) 4,000 Units Given 07/13/18 0758   Total dose as of 07/19/18 1240 2,000 Units Given 0809   6,000 Units        iohexol (OMNIPAQUE) 350 MG/ML injection (mL) 75 mL Given 07/13/18 0825   Total dose as of 07/19/18 1240        75 mL        Sedation Time   Sedation Time Physician-1: 51 minutes 4 seconds  Complications   Complications documented before study signed (07/13/2018 8:42 AM EST)    No complications were associated with this study.  Documented by Corky Crafts, MD - 07/13/2018 8:42 AM EST    Coronary Findings   Diagnostic  Dominance: Right  Left Anterior Descending  There is mild diffuse disease throughout the vessel.  Prox LAD lesion 25% stenosed  Prox LAD lesion is 25% stenosed. The lesion is eccentric. The lesion is  moderately calcified.  Left Circumflex  Ost Cx to Prox Cx lesion 50% stenosed  Ost Cx to Prox Cx lesion is 50% stenosed.  Prox Cx lesion 80% stenosed  Prox Cx lesion is 80% stenosed.  Mid Cx to Dist Cx lesion 90% stenosed  Mid Cx to  Dist Cx lesion is 90% stenosed.  Second Obtuse Marginal Branch  2nd Mrg lesion 75% stenosed  2nd Mrg lesion is 75% stenosed.  Lateral Second Obtuse Marginal Branch  Lat 2nd Mrg lesion 75% stenosed  Lat 2nd Mrg lesion is 75% stenosed.  Right Coronary Artery  Prox RCA lesion 30% stenosed  Prox RCA lesion is 30% stenosed.  Mid RCA lesion 30% stenosed  Mid RCA lesion is 30% stenosed. The lesion was previously treated using a drug eluting stent over 2 years ago.  Right Posterior Descending Artery  Ost RPDA lesion 25% stenosed  Ost RPDA lesion is 25% stenosed.  Intervention   No interventions have been documented.  Right Heart   Right Heart Pressures AO saturation 98%, PA saturation 76%, mean PA pressure 18 mmHg, mean wedge pressure 13 mmHg, cardiac output 5.75 L/min, cardiac index 2.9  Coronary Diagrams   Diagnostic  Dominance: Right    Intervention   Implants    No implant documentation for this case.  Syngo Images      Show images for CARDIAC CATHETERIZATION  MERGE Images   Show images for CARDIAC CATHETERIZATION   Link to Procedure Log   Procedure Log    Hemo Data    Most Recent Value  Fick Cardiac Output 5.75 L/min  Fick Cardiac Output Index 2.98 (L/min)/BSA  RA A Wave 7 mmHg  RA V Wave 5 mmHg  RA Mean 4 mmHg  RV Systolic Pressure 32 mmHg  RV Diastolic Pressure 0 mmHg  RV EDP 4 mmHg  PA Systolic Pressure 27 mmHg  PA Diastolic Pressure 10 mmHg  PA Mean 18 mmHg  PW A Wave 18 mmHg  PW V Wave 16 mmHg  PW Mean 13 mmHg  AO Systolic Pressure 115 mmHg  AO Diastolic Pressure 53 mmHg  AO Mean 76 mmHg  QP/QS 1  TPVR Index 6.03 HRUI  TSVR Index 25.46 HRUI  PVR SVR Ratio 0.07  TPVR/TSVR Ratio 0.24    Impression:  This 68 year old gentleman has stage D, severe, symptomatic aortic stenosis with New York Heart Association class II-III symptoms of exertional fatigue and shortness of breath associated with exertional chest discomfort and dizziness  consistent with chronic diastolic congestive heart failure.  Cardiac catheterization shows 80% proximal left circumflex stenosis compromising a large marginal branch.  His symptoms sound like they are more likely related to progressive aortic stenosis.  I reviewed the cardiac catheterization films and the echocardiogram images with the patient and his wife and answered their questions.  I think the best option is to proceed with coronary artery bypass graft surgery to the left circumflex system and aortic valve replacement using a bioprosthetic pericardial valve. I discussed the operative procedure with the patient and his wife including alternatives, benefits and risks; including but not limited to bleeding, blood transfusion, infection, stroke, myocardial infarction, graft failure, heart block requiring a permanent pacemaker, organ dysfunction, and death.  Joseph Morse understands and agrees to proceed.     Plan:  Aortic valve replacement using a bioprosthetic valve and coronary bypass graft surgery.  Joseph Morse.   Joseph Donathan K Analee Montee, MD [facility] [phone]

## 2018-07-30 NOTE — Anesthesia Procedure Notes (Signed)
Procedure Name: Intubation Date/Time: 07/30/2018 7:51 AM Performed by: Moshe Salisbury, CRNA Pre-anesthesia Checklist: Patient identified, Emergency Drugs available, Suction available and Patient being monitored Patient Re-evaluated:Patient Re-evaluated prior to induction Oxygen Delivery Method: Circle System Utilized Preoxygenation: Pre-oxygenation with 100% oxygen Induction Type: IV induction, Rapid sequence and Cricoid Pressure applied Laryngoscope Size: Mac and 4 Grade View: Grade II Tube type: Oral Tube size: 8.0 mm Number of attempts: 1 Airway Equipment and Method: Stylet Placement Confirmation: ETT inserted through vocal cords under direct vision,  positive ETCO2 and breath sounds checked- equal and bilateral Secured at: 23 cm Tube secured with: Tape Dental Injury: Teeth and Oropharynx as per pre-operative assessment

## 2018-07-31 ENCOUNTER — Inpatient Hospital Stay (HOSPITAL_COMMUNITY): Payer: Medicare Other

## 2018-07-31 LAB — GLUCOSE, CAPILLARY
GLUCOSE-CAPILLARY: 107 mg/dL — AB (ref 70–99)
GLUCOSE-CAPILLARY: 108 mg/dL — AB (ref 70–99)
GLUCOSE-CAPILLARY: 163 mg/dL — AB (ref 70–99)
Glucose-Capillary: 111 mg/dL — ABNORMAL HIGH (ref 70–99)
Glucose-Capillary: 116 mg/dL — ABNORMAL HIGH (ref 70–99)
Glucose-Capillary: 116 mg/dL — ABNORMAL HIGH (ref 70–99)
Glucose-Capillary: 121 mg/dL — ABNORMAL HIGH (ref 70–99)
Glucose-Capillary: 124 mg/dL — ABNORMAL HIGH (ref 70–99)
Glucose-Capillary: 129 mg/dL — ABNORMAL HIGH (ref 70–99)
Glucose-Capillary: 130 mg/dL — ABNORMAL HIGH (ref 70–99)
Glucose-Capillary: 145 mg/dL — ABNORMAL HIGH (ref 70–99)
Glucose-Capillary: 149 mg/dL — ABNORMAL HIGH (ref 70–99)
Glucose-Capillary: 99 mg/dL (ref 70–99)

## 2018-07-31 LAB — CBC
HCT: 33 % — ABNORMAL LOW (ref 39.0–52.0)
HCT: 35.2 % — ABNORMAL LOW (ref 39.0–52.0)
Hemoglobin: 10.7 g/dL — ABNORMAL LOW (ref 13.0–17.0)
Hemoglobin: 11.2 g/dL — ABNORMAL LOW (ref 13.0–17.0)
MCH: 29.7 pg (ref 26.0–34.0)
MCH: 29.6 pg (ref 26.0–34.0)
MCHC: 32.4 g/dL (ref 30.0–36.0)
MCHC: 31.8 g/dL (ref 30.0–36.0)
MCV: 91.7 fL (ref 80.0–100.0)
MCV: 92.9 fL (ref 80.0–100.0)
Platelets: 126 10*3/uL — ABNORMAL LOW (ref 150–400)
Platelets: 150 10*3/uL (ref 150–400)
RBC: 3.6 MIL/uL — ABNORMAL LOW (ref 4.22–5.81)
RBC: 3.79 MIL/uL — AB (ref 4.22–5.81)
RDW: 13.3 % (ref 11.5–15.5)
RDW: 13.6 % (ref 11.5–15.5)
WBC: 13.8 10*3/uL — ABNORMAL HIGH (ref 4.0–10.5)
WBC: 17 10*3/uL — AB (ref 4.0–10.5)
nRBC: 0 % (ref 0.0–0.2)
nRBC: 0 % (ref 0.0–0.2)

## 2018-07-31 LAB — BASIC METABOLIC PANEL
Anion gap: 4 — ABNORMAL LOW (ref 5–15)
Anion gap: 6 (ref 5–15)
BUN: 13 mg/dL (ref 8–23)
BUN: 16 mg/dL (ref 8–23)
CO2: 22 mmol/L (ref 22–32)
CO2: 23 mmol/L (ref 22–32)
Calcium: 7.7 mg/dL — ABNORMAL LOW (ref 8.9–10.3)
Calcium: 8.2 mg/dL — ABNORMAL LOW (ref 8.9–10.3)
Chloride: 111 mmol/L (ref 98–111)
Chloride: 107 mmol/L (ref 98–111)
Creatinine, Ser: 0.93 mg/dL (ref 0.61–1.24)
Creatinine, Ser: 1.19 mg/dL (ref 0.61–1.24)
GFR calc Af Amer: >60 mL/min (ref >60)
GFR calc Af Amer: >60 mL/min (ref >60)
GFR calc non Af Amer: >60 mL/min (ref >60)
GFR calc non Af Amer: >60 mL/min (ref >60)
Glucose, Bld: 124 mg/dL — ABNORMAL HIGH (ref 70–99)
Glucose, Bld: 178 mg/dL — ABNORMAL HIGH (ref 70–99)
Potassium: 4.2 mmol/L (ref 3.5–5.1)
Potassium: 4.3 mmol/L (ref 3.5–5.1)
Sodium: 137 mmol/L (ref 135–145)
Sodium: 136 mmol/L (ref 135–145)

## 2018-07-31 LAB — MAGNESIUM
Magnesium: 2.5 mg/dL — ABNORMAL HIGH (ref 1.7–2.4)
Magnesium: 2.4 mg/dL (ref 1.7–2.4)

## 2018-07-31 MED ORDER — INSULIN ASPART 100 UNIT/ML ~~LOC~~ SOLN
0.0000 [IU] | SUBCUTANEOUS | Status: DC
  Administered 2018-07-31: 4 [IU] via SUBCUTANEOUS
  Administered 2018-07-31 – 2018-08-01 (×3): 2 [IU] via SUBCUTANEOUS
  Administered 2018-08-01: 4 [IU] via SUBCUTANEOUS
  Administered 2018-08-01 – 2018-08-02 (×3): 2 [IU] via SUBCUTANEOUS

## 2018-07-31 MED ORDER — ENOXAPARIN SODIUM 40 MG/0.4ML ~~LOC~~ SOLN
40.0000 mg | Freq: Every day | SUBCUTANEOUS | Status: DC
  Administered 2018-08-01 – 2018-08-04 (×4): 40 mg via SUBCUTANEOUS
  Filled 2018-07-31 (×4): qty 0.4

## 2018-07-31 MED ORDER — FUROSEMIDE 10 MG/ML IJ SOLN
20.0000 mg | Freq: Two times a day (BID) | INTRAMUSCULAR | Status: AC
  Administered 2018-07-31 – 2018-08-01 (×3): 20 mg via INTRAVENOUS
  Filled 2018-07-31 (×3): qty 2

## 2018-07-31 NOTE — Progress Notes (Signed)
[address]       [place] [address]             [phone]        CARDIOTHORACIC SURGERY PROGRESS NOTE   R1 Day Post-Op Procedure(s) (LRB): CORONARY ARTERY BYPASS GRAFTING (CABG)x 2  , using left internal mammary artery and right leg greater saphenous  vein harvested endoscopically (N/A) AORTIC VALVE REPLACEMENT (AVR) (N/A) TRANSESOPHAGEAL ECHOCARDIOGRAM (TEE) (N/A)  Subjective: Looks good.  Expected soreness in chest.  No SOB.  No nausea.  Objective: Vital signs: BP Readings from Last 1 Encounters:  07/31/18 103/67   Pulse Readings from Last 1 Encounters:  07/31/18 65   Resp Readings from Last 1 Encounters:  07/31/18 20   Temp Readings from Last 1 Encounters:  07/31/18 98.8 F (37.1 C) (Core)    Hemodynamics: PAP: (21-36)/(10-16) 29/12 CO:  [3.4 L/min-6.7 L/min] 5.7 L/min CI:  [1.8 L/min/m2-3.5 L/min/m2] 3 L/min/m2  Physical Exam:  Rhythm:   sinus  Breath sounds: clear  Heart sounds:  RRR  Incisions:  Dressings dry, intact  Abdomen:  Soft, non-distended, non-tender  Extremities:  Warm, well-perfused  Chest tubes:  low volume thin serosanguinous output, no air leak    Intake/Output from previous day: 03/06 0701 - 03/07 0700 In: 4747.8 [I.V.:3000; Blood:400; IV Piggyback:1347.8] Out: 3604 [Urine:2550; Blood:800; Chest Tube:254] Intake/Output this shift: Total I/O In: -  Out: 30 [Chest Tube:30]  Lab Results:  CBC: Recent Labs    07/30/18 1809 07/30/18 1818 07/31/18 0520  WBC 16.3*  --  13.8*  HGB 11.2* 10.9* 10.7*  HCT 34.6* 32.0* 33.0*  PLT 137*  --  126*    BMET:  Recent Labs    07/30/18 1809 07/30/18 1818 07/31/18 0520  NA 138 139 137  K 4.5 4.5 4.2  CL 111  --  111  CO2 23  --  22  GLUCOSE 146*  --  124*  BUN 12  --  13  CREATININE 1.01  --  0.93  CALCIUM 7.7*  --  7.7*     PT/INR:   Recent Labs    07/30/18 1324  LABPROT 19.8*  INR 1.7*    CBG (last 3)  Recent Labs    07/31/18 0523  07/31/18 0701 07/31/18 0807  GLUCAP 111* 116* 121*    ABG    Component Value Date/Time   PHART 7.323 (L) 07/30/2018 1818   PCO2ART 41.9 07/30/2018 1818   PO2ART 115.0 (H) 07/30/2018 1818   HCO3 21.8 07/30/2018 1818   TCO2 23 07/30/2018 1818   ACIDBASEDEF 4.0 (H) 07/30/2018 1818   O2SAT 98.0 07/30/2018 1818    CXR: PORTABLE CHEST 1 VIEW  COMPARISON:  07/30/2018  FINDINGS: Endotracheal tube and gastric catheter have been removed in the interval. Swan-Ganz catheter is noted in the right pulmonary outflow tract. Mediastinal drain and pericardial drain are noted. Mild left pleural effusion is noted.  IMPRESSION: Tubes and lines as described above. Mild left basilar effusion is seen.   Electronically Signed   By: Alcide Clever M.D.   On: 07/31/2018 08:40   EKG: NSR w/out acute ischemic changes, LBBB (old)   Assessment/Plan: S/P Procedure(s) (LRB): CORONARY ARTERY BYPASS GRAFTING (CABG)x 2  , using left internal mammary artery and right leg greater saphenous  vein harvested endoscopically (N/A) AORTIC VALVE REPLACEMENT (AVR) (N/A) TRANSESOPHAGEAL ECHOCARDIOGRAM (TEE) (N/A)  Doing well POD1 Maintaining NSR w/ stable hemodynamics, no drips Breathing comfortably w/ O2 sats 96% on room  air Expected post op acute blood loss anemia, mild Expected post op atelectasis, mild Expected post op volume excess, weight reportedly 3 kg > preop, UOP adequate   Mobilize  D/C tubes and lines  Gentle diuresis  Routine care   Purcell Nails, MD 07/31/2018 9:30 AM

## 2018-07-31 NOTE — Progress Notes (Signed)
[facility] BRIEF SICU PROGRESS NOTE  1 Day Post-Op  S/P Procedure(s) (LRB): CORONARY ARTERY BYPASS GRAFTING (CABG)x 2  , using left internal mammary artery and right leg greater saphenous  vein harvested endoscopically (N/A) AORTIC VALVE REPLACEMENT (AVR) (N/A) TRANSESOPHAGEAL ECHOCARDIOGRAM (TEE) (N/A)   Stable day NSR w/ stable BP Breathing comfortably w/ O2 sats 96% on room air UOP adequate Labs okay  Plan: Continue routine care  Purcell Nails, MD 07/31/2018 6:12 PM

## 2018-08-01 ENCOUNTER — Inpatient Hospital Stay (HOSPITAL_COMMUNITY): Payer: Medicare Other

## 2018-08-01 LAB — GLUCOSE, CAPILLARY
Glucose-Capillary: 113 mg/dL — ABNORMAL HIGH (ref 70–99)
Glucose-Capillary: 116 mg/dL — ABNORMAL HIGH (ref 70–99)
Glucose-Capillary: 121 mg/dL — ABNORMAL HIGH (ref 70–99)
Glucose-Capillary: 140 mg/dL — ABNORMAL HIGH (ref 70–99)
Glucose-Capillary: 163 mg/dL — ABNORMAL HIGH (ref 70–99)
Glucose-Capillary: 90 mg/dL (ref 70–99)

## 2018-08-01 LAB — BASIC METABOLIC PANEL
Anion gap: 8 (ref 5–15)
BUN: 22 mg/dL (ref 8–23)
CO2: 25 mmol/L (ref 22–32)
Calcium: 8.1 mg/dL — ABNORMAL LOW (ref 8.9–10.3)
Chloride: 101 mmol/L (ref 98–111)
Creatinine, Ser: 1.24 mg/dL (ref 0.61–1.24)
GFR calc Af Amer: >60 mL/min (ref >60)
GFR calc non Af Amer: 60 mL/min — ABNORMAL LOW (ref >60)
Glucose, Bld: 143 mg/dL — ABNORMAL HIGH (ref 70–99)
Potassium: 4.3 mmol/L (ref 3.5–5.1)
Sodium: 134 mmol/L — ABNORMAL LOW (ref 135–145)

## 2018-08-01 LAB — CBC
HEMATOCRIT: 31 % — AB (ref 39.0–52.0)
Hemoglobin: 10.3 g/dL — ABNORMAL LOW (ref 13.0–17.0)
MCH: 30.9 pg (ref 26.0–34.0)
MCHC: 33.2 g/dL (ref 30.0–36.0)
MCV: 93.1 fL (ref 80.0–100.0)
Platelets: 131 10*3/uL — ABNORMAL LOW (ref 150–400)
RBC: 3.33 MIL/uL — ABNORMAL LOW (ref 4.22–5.81)
RDW: 13.8 % (ref 11.5–15.5)
WBC: 16.4 10*3/uL — ABNORMAL HIGH (ref 4.0–10.5)
nRBC: 0 % (ref 0.0–0.2)

## 2018-08-01 MED ORDER — INFLUENZA VAC SPLIT HIGH-DOSE 0.5 ML IM SUSY
0.5000 mL | PREFILLED_SYRINGE | INTRAMUSCULAR | Status: AC
  Administered 2018-08-02: 0.5 mL via INTRAMUSCULAR
  Filled 2018-08-01 (×2): qty 0.5

## 2018-08-01 MED ORDER — MOVING RIGHT ALONG BOOK
Freq: Once | Status: AC
  Administered 2018-08-01: 13:00:00

## 2018-08-01 MED ORDER — AMIODARONE IV BOLUS ONLY 150 MG/100ML
150.0000 mg | Freq: Once | INTRAVENOUS | Status: AC
  Administered 2018-08-01: 150 mg via INTRAVENOUS
  Filled 2018-08-01: qty 100

## 2018-08-01 MED ORDER — FUROSEMIDE 40 MG PO TABS
40.0000 mg | ORAL_TABLET | Freq: Every day | ORAL | Status: AC
  Administered 2018-08-02 – 2018-08-03 (×2): 40 mg via ORAL
  Filled 2018-08-01 (×2): qty 1

## 2018-08-01 MED ORDER — AMIODARONE HCL 200 MG PO TABS
200.0000 mg | ORAL_TABLET | Freq: Two times a day (BID) | ORAL | Status: DC
  Administered 2018-08-01 – 2018-08-05 (×8): 200 mg via ORAL
  Filled 2018-08-01 (×8): qty 1

## 2018-08-01 MED ORDER — PNEUMOCOCCAL VAC POLYVALENT 25 MCG/0.5ML IJ INJ
0.5000 mL | INJECTION | INTRAMUSCULAR | Status: AC
  Administered 2018-08-02: 0.5 mL via INTRAMUSCULAR
  Filled 2018-08-01: qty 0.5

## 2018-08-01 MED ORDER — AMIODARONE HCL IN DEXTROSE 360-4.14 MG/200ML-% IV SOLN
INTRAVENOUS | Status: AC
  Filled 2018-08-01: qty 200

## 2018-08-01 MED ORDER — POTASSIUM CHLORIDE CRYS ER 20 MEQ PO TBCR
20.0000 meq | EXTENDED_RELEASE_TABLET | Freq: Every day | ORAL | Status: AC
  Administered 2018-08-02 – 2018-08-03 (×2): 20 meq via ORAL
  Filled 2018-08-01 (×2): qty 1

## 2018-08-01 NOTE — Progress Notes (Signed)
      [address]       [place] [address]             [phone]        CARDIOTHORACIC SURGERY PROGRESS NOTE   R2 Days Post-Op Procedure(s) (LRB): CORONARY ARTERY BYPASS GRAFTING (CABG)x 2  , using left internal mammary artery and right leg greater saphenous  vein harvested endoscopically (N/A) AORTIC VALVE REPLACEMENT (AVR) (N/A) TRANSESOPHAGEAL ECHOCARDIOGRAM (TEE) (N/A)  Subjective: Looks great.  Had a good night.  Minimal pain.  No SOB.  Ambulating well.  Objective: Vital signs: BP Readings from Last 1 Encounters:  08/01/18 115/84   Pulse Readings from Last 1 Encounters:  08/01/18 (!) 59   Resp Readings from Last 1 Encounters:  08/01/18 12   Temp Readings from Last 1 Encounters:  08/01/18 97.6 F (36.4 C) (Oral)    Hemodynamics:    Physical Exam:  Rhythm:   sinus  Breath sounds: clear  Heart sounds:  RRR  Incisions:  Clean and dry  Abdomen:  Soft, non-distended, non-tender  Extremities:  Warm, well-perfused    Intake/Output from previous day: 03/07 0701 - 03/08 0700 In: 1686.9 [P.O.:1200; I.V.:386.9; IV Piggyback:100] Out: 670 [Urine:600; Chest Tube:70] Intake/Output this shift: Total I/O In: 580 [P.O.:480; IV Piggyback:100] Out: 245 [Urine:245]  Lab Results:  CBC: Recent Labs    07/31/18 1500 08/01/18 0419  WBC 17.0* 16.4*  HGB 11.2* 10.3*  HCT 35.2* 31.0*  PLT 150 131*    BMET:  Recent Labs    07/31/18 1500 08/01/18 0419  NA 136 134*  K 4.3 4.3  CL 107 101  CO2 23 25  GLUCOSE 178* 143*  BUN 16 22  CREATININE 1.19 1.24  CALCIUM 8.2* 8.1*     PT/INR:   Recent Labs    07/30/18 1324  LABPROT 19.8*  INR 1.7*    CBG (last 3)  Recent Labs    07/31/18 2354 08/01/18 0330 08/01/18 0747  GLUCAP 130* 140* 163*    ABG    Component Value Date/Time   PHART 7.323 (L) 07/30/2018 1818   PCO2ART 41.9 07/30/2018 1818   PO2ART 115.0 (H) 07/30/2018 1818   HCO3 21.8 07/30/2018 1818   TCO2 23 07/30/2018 1818     ACIDBASEDEF 4.0 (H) 07/30/2018 1818   O2SAT 98.0 07/30/2018 1818    CXR: PORTABLE CHEST 1 VIEW  COMPARISON:  07/31/2018  FINDINGS: Cardiac shadow is enlarged. Postsurgical changes are again seen. Swan-Ganz catheter has been removed although a right jugular sheath remains in place. Mediastinal drain and pericardial drain have been removed in the interval. Mild left basilar atelectasis and small effusion is seen.  IMPRESSION: Stable left basilar effusion and mild atelectasis.   Electronically Signed   By: Alcide Clever M.D.   On: 08/01/2018 07:24   Assessment/Plan: S/P Procedure(s) (LRB): CORONARY ARTERY BYPASS GRAFTING (CABG)x 2  , using left internal mammary artery and right leg greater saphenous  vein harvested endoscopically (N/A) AORTIC VALVE REPLACEMENT (AVR) (N/A) TRANSESOPHAGEAL ECHOCARDIOGRAM (TEE) (N/A)  Doing well POD2 Maintaining NSR w/ stable BP Breathing comfortably w/ O2 sats 96% on room air Expected post op acute blood loss anemia, mild Expected post op atelectasis, mild Expected post op volume excess, weight reportedly 3 kg > preop, UOP adequate   Mobilize  Gentle diuresis  Transfer 4E  Routine care   Purcell Nails, MD 08/01/2018 11:02 AM

## 2018-08-01 NOTE — Plan of Care (Signed)
  Problem: Education: Goal: Knowledge of General Education information will improve Description Including pain rating scale, medication(s)/side effects and non-pharmacologic comfort measures Outcome: Progressing   Problem: Health Behavior/Discharge Planning: Goal: Ability to manage health-related needs will improve Outcome: Progressing   Problem: Clinical Measurements: Goal: Ability to maintain clinical measurements within normal limits will improve Outcome: Progressing Goal: Will remain free from infection Outcome: Progressing Goal: Diagnostic test results will improve Outcome: Progressing Goal: Respiratory complications will improve Outcome: Progressing Goal: Cardiovascular complication will be avoided Outcome: Progressing   Problem: Activity: Goal: Risk for activity intolerance will decrease Outcome: Progressing   Problem: Nutrition: Goal: Adequate nutrition will be maintained Outcome: Progressing   Problem: Coping: Goal: Level of anxiety will decrease Outcome: Progressing   Problem: Elimination: Goal: Will not experience complications related to bowel motility Outcome: Progressing Goal: Will not experience complications related to urinary retention Outcome: Progressing   Problem: Pain Managment: Goal: General experience of comfort will improve Outcome: Progressing   Problem: Education: Goal: Will demonstrate proper wound care and an understanding of methods to prevent future damage Outcome: Progressing Goal: Knowledge of disease or condition will improve Outcome: Progressing Goal: Knowledge of the prescribed therapeutic regimen will improve Outcome: Progressing Goal: Individualized Educational Video(s) Outcome: Progressing   Problem: Clinical Measurements: Goal: Postoperative complications will be avoided or minimized Outcome: Progressing   Problem: Urinary Elimination: Goal: Ability to achieve and maintain adequate renal perfusion and functioning will  improve Outcome: Progressing

## 2018-08-02 ENCOUNTER — Inpatient Hospital Stay (HOSPITAL_COMMUNITY): Payer: Medicare Other

## 2018-08-02 ENCOUNTER — Encounter (HOSPITAL_COMMUNITY): Payer: Self-pay | Admitting: Surgery

## 2018-08-02 LAB — BASIC METABOLIC PANEL
Anion gap: 8 (ref 5–15)
BUN: 17 mg/dL (ref 8–23)
CO2: 25 mmol/L (ref 22–32)
Calcium: 8 mg/dL — ABNORMAL LOW (ref 8.9–10.3)
Chloride: 99 mmol/L (ref 98–111)
Creatinine, Ser: 1.03 mg/dL (ref 0.61–1.24)
GFR calc Af Amer: >60 mL/min (ref >60)
GFR calc non Af Amer: >60 mL/min (ref >60)
Glucose, Bld: 126 mg/dL — ABNORMAL HIGH (ref 70–99)
Potassium: 3.9 mmol/L (ref 3.5–5.1)
Sodium: 132 mmol/L — ABNORMAL LOW (ref 135–145)

## 2018-08-02 LAB — POCT I-STAT 7, (LYTES, BLD GAS, ICA,H+H)
Acid-Base Excess: 1 mmol/L (ref 0.0–2.0)
Acid-Base Excess: 3 mmol/L — ABNORMAL HIGH (ref 0.0–2.0)
Bicarbonate: 27 mmol/L (ref 20.0–28.0)
Bicarbonate: 27.6 mmol/L (ref 20.0–28.0)
Bicarbonate: 24.4 mmol/L (ref 20.0–28.0)
Calcium, Ion: 1.27 mmol/L (ref 1.15–1.40)
Calcium, Ion: 0.94 mmol/L — ABNORMAL LOW (ref 1.15–1.40)
Calcium, Ion: 1.09 mmol/L — ABNORMAL LOW (ref 1.15–1.40)
HCT: 38 % — ABNORMAL LOW (ref 39.0–52.0)
HCT: 28 % — ABNORMAL LOW (ref 39.0–52.0)
HCT: 27 % — ABNORMAL LOW (ref 39.0–52.0)
HEMOGLOBIN: 12.9 g/dL — AB (ref 13.0–17.0)
Hemoglobin: 9.5 g/dL — ABNORMAL LOW (ref 13.0–17.0)
Hemoglobin: 9.2 g/dL — ABNORMAL LOW (ref 13.0–17.0)
O2 Saturation: 100 %
O2 Saturation: 100 %
O2 Saturation: 100 %
PO2 ART: 242 mmHg — AB (ref 83.0–108.0)
Potassium: 4.9 mmol/L (ref 3.5–5.1)
Potassium: 4.3 mmol/L (ref 3.5–5.1)
Potassium: 4.3 mmol/L (ref 3.5–5.1)
Sodium: 139 mmol/L (ref 135–145)
Sodium: 141 mmol/L (ref 135–145)
Sodium: 140 mmol/L (ref 135–145)
TCO2: 28 mmol/L (ref 22–32)
TCO2: 29 mmol/L (ref 22–32)
TCO2: 26 mmol/L (ref 22–32)
pCO2 arterial: 47.2 mmHg (ref 32.0–48.0)
pCO2 arterial: 40.6 mmHg (ref 32.0–48.0)
pCO2 arterial: 36.6 mmHg (ref 32.0–48.0)
pH, Arterial: 7.365 (ref 7.350–7.450)
pH, Arterial: 7.441 (ref 7.350–7.450)
pH, Arterial: 7.432 (ref 7.350–7.450)
pO2, Arterial: 390 mmHg — ABNORMAL HIGH (ref 83.0–108.0)
pO2, Arterial: 388 mmHg — ABNORMAL HIGH (ref 83.0–108.0)

## 2018-08-02 LAB — CBC
HEMATOCRIT: 28.6 % — AB (ref 39.0–52.0)
Hemoglobin: 9.6 g/dL — ABNORMAL LOW (ref 13.0–17.0)
MCH: 30.3 pg (ref 26.0–34.0)
MCHC: 33.6 g/dL (ref 30.0–36.0)
MCV: 90.2 fL (ref 80.0–100.0)
Platelets: 131 10*3/uL — ABNORMAL LOW (ref 150–400)
RBC: 3.17 MIL/uL — ABNORMAL LOW (ref 4.22–5.81)
RDW: 13.4 % (ref 11.5–15.5)
WBC: 15.2 10*3/uL — AB (ref 4.0–10.5)
nRBC: 0 % (ref 0.0–0.2)

## 2018-08-02 LAB — POCT I-STAT 4, (NA,K, GLUC, HGB,HCT)
Glucose, Bld: 166 mg/dL — ABNORMAL HIGH (ref 70–99)
Glucose, Bld: 101 mg/dL — ABNORMAL HIGH (ref 70–99)
Glucose, Bld: 126 mg/dL — ABNORMAL HIGH (ref 70–99)
Glucose, Bld: 122 mg/dL — ABNORMAL HIGH (ref 70–99)
Glucose, Bld: 130 mg/dL — ABNORMAL HIGH (ref 70–99)
Glucose, Bld: 132 mg/dL — ABNORMAL HIGH (ref 70–99)
HCT: 36 % — ABNORMAL LOW (ref 39.0–52.0)
HCT: 26 % — ABNORMAL LOW (ref 39.0–52.0)
HCT: 32 % — ABNORMAL LOW (ref 39.0–52.0)
HCT: 30 % — ABNORMAL LOW (ref 39.0–52.0)
HCT: 29 % — ABNORMAL LOW (ref 39.0–52.0)
HCT: 26 % — ABNORMAL LOW (ref 39.0–52.0)
Hemoglobin: 12.2 g/dL — ABNORMAL LOW (ref 13.0–17.0)
Hemoglobin: 10.9 g/dL — ABNORMAL LOW (ref 13.0–17.0)
Hemoglobin: 8.8 g/dL — ABNORMAL LOW (ref 13.0–17.0)
Hemoglobin: 10.2 g/dL — ABNORMAL LOW (ref 13.0–17.0)
Hemoglobin: 9.9 g/dL — ABNORMAL LOW (ref 13.0–17.0)
Hemoglobin: 8.8 g/dL — ABNORMAL LOW (ref 13.0–17.0)
Potassium: 4.9 mmol/L (ref 3.5–5.1)
Potassium: 4.3 mmol/L (ref 3.5–5.1)
Potassium: 4.4 mmol/L (ref 3.5–5.1)
Potassium: 5.1 mmol/L (ref 3.5–5.1)
Potassium: 5.8 mmol/L — ABNORMAL HIGH (ref 3.5–5.1)
Potassium: 4.3 mmol/L (ref 3.5–5.1)
SODIUM: 138 mmol/L (ref 135–145)
SODIUM: 141 mmol/L (ref 135–145)
Sodium: 139 mmol/L (ref 135–145)
Sodium: 140 mmol/L (ref 135–145)
Sodium: 141 mmol/L (ref 135–145)
Sodium: 140 mmol/L (ref 135–145)

## 2018-08-02 LAB — GLUCOSE, CAPILLARY
GLUCOSE-CAPILLARY: 185 mg/dL — AB (ref 70–99)
Glucose-Capillary: 126 mg/dL — ABNORMAL HIGH (ref 70–99)
Glucose-Capillary: 152 mg/dL — ABNORMAL HIGH (ref 70–99)
Glucose-Capillary: 130 mg/dL — ABNORMAL HIGH (ref 70–99)

## 2018-08-02 MED ORDER — TRAMADOL HCL 50 MG PO TABS: 50.0000 mg | ORAL_TABLET | ORAL | Status: DC | PRN

## 2018-08-02 NOTE — Progress Notes (Signed)
3 Days Post-Op Procedure(s) (LRB): CORONARY ARTERY BYPASS GRAFTING (CABG)x 2  , using left internal mammary artery and right leg greater saphenous  vein harvested endoscopically (N/A) AORTIC VALVE REPLACEMENT (AVR) (N/A) TRANSESOPHAGEAL ECHOCARDIOGRAM (TEE) (N/A) Subjective:  did not sleep much because he went into atrial fib last night with HR 110-120. He was given bolus of amio and started on 200 bid po. He is sinus 65 this am. Ambulated yesterday. No BM yet  Objective: Vital signs in last 24 hours: Temp:  [98 F (36.7 C)-98.2 F (36.8 C)] 98.1 F (36.7 C) (03/09 0445) Pulse Rate:  [58-113] 107 (03/09 0445) Cardiac Rhythm: Atrial fibrillation (03/09 0445) Resp:  [12-23] 18 (03/09 0445) BP: (109-149)/(60-87) 124/81 (03/09 0445) SpO2:  [94 %-100 %] 99 % (03/09 0445) Weight:  [80.9 kg] 80.9 kg (03/09 0445)  Hemodynamic parameters for last 24 hours:    Intake/Output from previous day: 03/08 0701 - 03/09 0700 In: 1018.7 [P.O.:820; I.V.:98.7; IV Piggyback:100] Out: 975 [Urine:975] Intake/Output this shift: No intake/output data recorded.  General appearance: alert and cooperative Neurologic: intact Heart: regular rate and rhythm, S1, S2 normal, no murmur, click, rub or gallop Lungs: clear to auscultation bilaterally Extremities: extremities normal, atraumatic, no cyanosis or edema Wound: incision ok  Lab Results: Recent Labs    08/01/18 0419 08/02/18 0236  WBC 16.4* 15.2*  HGB 10.3* 9.6*  HCT 31.0* 28.6*  PLT 131* 131*   BMET:  Recent Labs    08/01/18 0419 08/02/18 0236  NA 134* 132*  K 4.3 3.9  CL 101 99  CO2 25 25  GLUCOSE 143* 126*  BUN 22 17  CREATININE 1.24 1.03  CALCIUM 8.1* 8.0*    PT/INR:  Recent Labs    07/30/18 1324  LABPROT 19.8*  INR 1.7*   ABG    Component Value Date/Time   PHART 7.323 (L) 07/30/2018 1818   HCO3 21.8 07/30/2018 1818   TCO2 23 07/30/2018 1818   ACIDBASEDEF 4.0 (H) 07/30/2018 1818   O2SAT 98.0 07/30/2018 1818   CBG  (last 3)  Recent Labs    08/01/18 1959 08/01/18 2332 08/02/18 0448  GLUCAP 116* 113* 126*    Assessment/Plan: S/P Procedure(s) (LRB): CORONARY ARTERY BYPASS GRAFTING (CABG)x 2  , using left internal mammary artery and right leg greater saphenous  vein harvested endoscopically (N/A) AORTIC VALVE REPLACEMENT (AVR) (N/A) TRANSESOPHAGEAL ECHOCARDIOGRAM (TEE) (N/A)  Hemodynamically stable in sinus rhythm this am.  Postop atrial fib converted with amio but has gone back and forth overnight. Will continue oral amio and Lopressor. 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 Keep pacer wires in for now. If he has more today we will probably need to start Coumadin. I discussed that with him.  Mild volume excess. Wt is 8 lbs over preop. Continue lasix and KCL.  Ambulate, IS.   LOS: 3 days    Alleen Borne 08/02/2018

## 2018-08-02 NOTE — Progress Notes (Signed)
Pt has been afib throughout the night. Pt did received Amio bolus x1 and PO Amio last night. Pt has been going to Afib with RVR (HR 100-120') every time he gets up or with little movement or sometimes resting in bed. Nonsustained. Pt stated he could feel the flutter when HR is up. BP has been stable. Pt has been very anxious and worried about HR being up. Educated pt multiple times that its not abnormal to have rhythm issues after CABG. Call bell within reach, family in the room. Will continue to monitor.

## 2018-08-02 NOTE — Progress Notes (Signed)
2030: Pt ambulated 700 ft with front wheel walker, 02 sat dropped to 88%. HR went up to 130's, pt went to AFIb/RVR, asymptomatic. Once got back in bed HR came down to 90-110. Afib conformed with 12 lead EKG. BP within normal. Placed pt on 2l 02 via [place], o2 sat 98%.   2106: DR. Cornelius Moras paged.  2110: DR. Cornelius Moras called back, notified MD of pt's situation. Received order for 150 mg iv amiodarone bolus x 1 and Amiodarone 200 mg PO twice a day. See MAR for medication administration. Call bell within reach. pt resting comfortably. Will continue to monitor.

## 2018-08-02 NOTE — Progress Notes (Signed)
CARDIAC REHAB PHASE I   PRE:  Rate/Rhythm: 66 SR  BP:  Sitting: 119/76      SaO2: 100 2L  MODE:  Ambulation: 350 ft   POST:  Rate/Rhythm: 75 SR  BP:  Sitting: 127/68    SaO2: 95 RA   Pt ambulated 3106ft in hallway standby assist with front wheel walker. Pt "taking it slow", because he went into Afib yesterday. Pt maintained SR throughout walk. Encouraged pt to continue IS use. RN aware pt on RA. Encouraged pt to walk twice more today. Will continue to follow.  [id number] [name], RN BSN 08/02/2018 8:41 AM

## 2018-08-03 LAB — PROTIME-INR
INR: 1.1 (ref 0.8–1.2)
Prothrombin Time: 14.1 seconds (ref 11.4–15.2)

## 2018-08-03 LAB — GLUCOSE, CAPILLARY: Glucose-Capillary: 101 mg/dL — ABNORMAL HIGH (ref 70–99)

## 2018-08-03 MED ORDER — WARFARIN SODIUM 2.5 MG PO TABS
2.5000 mg | ORAL_TABLET | Freq: Once | ORAL | Status: AC
  Administered 2018-08-03: 2.5 mg via ORAL
  Filled 2018-08-03: qty 1

## 2018-08-03 MED ORDER — METOPROLOL TARTRATE 25 MG PO TABS
25.0000 mg | ORAL_TABLET | Freq: Two times a day (BID) | ORAL | Status: DC
  Administered 2018-08-03 – 2018-08-05 (×5): 25 mg via ORAL
  Filled 2018-08-03 (×5): qty 1

## 2018-08-03 MED ORDER — COUMADIN BOOK
Freq: Once | Status: AC
  Administered 2018-08-03: 1

## 2018-08-03 MED ORDER — WARFARIN - PHYSICIAN DOSING INPATIENT: Freq: Every day | Status: DC

## 2018-08-03 NOTE — Progress Notes (Signed)
CARDIAC REHAB PHASE I   PRE:  Rate/Rhythm: 91 Afib  BP:  Sitting: 137/77      SaO2: 94 RA  MODE:  Ambulation: 470 ft    100 peak HR  POST:  Rate/Rhythm: 85 Afib  BP:  Sitting: 121/85    SaO2: 96 RA   Pt ambulated 447ft in hallway independently with front wheel walker. Pt denies CP, palpitations or SOB. Pt returned to bed, call bell and phone within reach. Encouraged continued IS use and two more walks today. Will continue to follow.  [id number] [name], RN BSN 08/03/2018 9:22 AM

## 2018-08-03 NOTE — Discharge Summary (Signed)
[address]       [place] [address]             [phone]      Physician Discharge Summary  Patient ID: Joseph Morse MRN: [id number] DOB/AGE: July 07, 1950 68 y.o.  Admit date: 07/30/2018 Discharge date: 08/05/2018  Admission Diagnoses:  Patient Active Problem List   Diagnosis Date Noted  . Angina pectoris (HCC)   . Nonrheumatic aortic valve stenosis   . Aortic valve disorder 10/18/2013  . Essential hypertension, benign 10/18/2013  . Mixed hyperlipidemia 10/18/2013  . Coronary atherosclerosis of native coronary artery 10/18/2013    Discharge Diagnoses:  Active Problems:   S/P CABG x 2   Discharged Condition: good  HPI:    The patient is a 68 year old gentleman with a history of hypertension, hyperlipidemia, bicuspid aortic valve disease with moderate aortic stenosis, and coronary artery disease status post drug-eluting stent placement in the right coronary artery in the past. His echocardiogram in 03/2017 showed a bicuspid aortic valve with calcified leaflets and restricted mobility. The mean gradient was 22 mmHg with a peak gradient 34 mmHg and a dimensionless index of 0.14. The valve area was measured at that time as0.45 cm. This was felt to represent moderate aortic stenosis. The left ventricular ejection fraction was 60 to 65% with grade 1 diastolic dysfunction. He recently presented with a several month history of exertional substernal chest discomfort that he describes as a burning sensation as well as shortness of breath occurring with exertion and relieved with rest. He used to be able to walk several miles without any difficulty and now has to stop multiple times to rest over the past few months. He has also noticed some chest pressure and shortness of breath with walking up stairs. He has had some dizziness and felt like he may pass out a couple times. A repeat echocardiogram on 07/12/2018 showed a severely thickened and calcified  aortic valve that appeared functionally bicuspid. The mean gradient was measured at 29 mmHg with a peak velocity of 3.84 m/s. Dimensionless index was less than 0.25 with a calculated aortic valve area of 0.81 cm. Left ventricular ejection fraction remains normal at 55 to 60%. There is mild dilation of the aortic root measured at 37 mm. Cardiac catheterization was performed on 07/13/2018. This showed the drug-eluting stents in the mid right coronary artery patent with mild in-stent restenosis. The LAD had no significant stenosis. Left circumflex had 80% stenosis at the bifurcation of a large marginal branch which was unchanged from prior catheterization in 2010. It was not possible to cross the aortic valve with an AL-1 catheter. Right heart pressures were within normal limits.   Hospital Course:   On 07/30/2018 Joseph Morse underwent a CABG x 2 with Dr. Laneta Simmers. He tolerated the procedure well and was transferred to the surgical ICU in stable condition. POD 1 he was doing well and maintaining NSR. He was on room air with excellent saturation. He did have some expected acute blood loss anemia. We discontinued his chest tubes and line. We initiated gentle diuresis for expected fluid overload. POD 2 he had minimal pain. He was progressing well. We continued to mobilize the patient. He was stable for transfer to the stepdown unit. POD 3 he was having some tachycardia. Amio orally was added to help control is rate. He remained hemodynamically stable and in NSR. We kept his pacer wires in place. We continued a diuretic regimen for fluid overload.  POD 4 he was making excellent progress. We started coumadin and were titrating it based on INR. His weight remained several pounds above baseline, therefore we continued a diuretic regimen. We increased his metoprolol to 25mg  BID for better heart rate and blood pressure control.    Consults: none  Significant Diagnostic Studies:    Mid RCA stent is patent with  mild in-stent restenosis  Prox RCA lesion is 30% stenosed.  Ost RPDA lesion is 25% stenosed.  Prox Cx lesion is 80% stenosed. This lesion appears unchanged from the 2010 cardiac cath.  Ost Cx to Prox Cx lesion is 50% stenosed.  Mid Cx to Dist Cx lesion is 90% stenosed. This lesion appears unchanged from the 2010 cardiac cath.  2nd Mrg lesion is 75% stenosed. Lat 2nd Mrg lesion is 75% stenosed. This bifurcation area in the OM represents new disease compared to 2010.  Prox LAD lesion is 25% stenosed.  Unable to cross aortic valve despite use of straight wire with AL-1 catheter.   Given the dramatic worsening of his symptoms, and the changes on his echocardiogram in terms of his aortic stenosis from 2018, I suspect his symptoms are coming more from aortic stenosis rather than coronary artery disease.  He has only had mild progression of his coronary disease over 10 years.  Will refer to surgery for aortic valve replacement with bypass to the circumflex territory.  He will need to avoid any strenuous activities given the presyncope that he has felt, as he walks up hills.  Continue Plavix for now.  Once he sees the surgeon and a date is set for surgery, they can hold the Plavix 5 days prior to surgery, at that point.      Treatments:  CARDIOVASCULAR SURGERY OPERATIVE NOTE  07/30/2018  Surgeon:  Alleen Borne, MD  First Assistant: Gershon Crane,  PA-C   Preoperative Diagnosis:  Severe multi-vessel coronary artery disease, severe aortic stenosis and moderate aortic insufficiency.   Postoperative Diagnosis:  Same   Procedure:  1. Median Sternotomy 2. Extracorporeal circulation 3.   Coronary artery bypass grafting x 2   Sequential SVG to OM 1 and OM2    4.   Endoscopic vein harvest from the right leg 5.   Aortic valve replacement using a 25 mm Edwards INSPIRIS RESILIA pericardial valve.   Anesthesia:  General Endotracheal  Discharge Exam: Blood  pressure 139/75, pulse 63, temperature 98.2 F (36.8 C), temperature source Oral, resp. rate 18, height 5' 9.5" (1.765 m), weight 79.8 kg, SpO2 96 %.    General appearance: alert and cooperative  Neurologic: intact  Heart: Monitor shows mostly SR with a brief episode of Afib last night. Lungs: clear to auscultation bilaterally  Extremities: extremities normal, no cyanosis or edema  Wound: incisions all well approximated and healing without complication.    Disposition:     Allergies as of 08/05/2018   No Known Allergies     Medication List    STOP taking these medications   carvedilol 3.125 MG tablet Commonly known as:  COREG   clopidogrel 75 MG tablet Commonly known as:  PLAVIX   lisinopril 20 MG tablet Commonly known as:  PRINIVIL,ZESTRIL     TAKE these medications   amiodarone 200 MG tablet Commonly known as:  PACERONE Take 1 tablet (200 mg total) by mouth 2 (two) times daily.   amoxicillin 500 MG capsule Commonly known as:  AMOXIL Take 4 capsules 1 hour prior to dental procedures   aspirin EC  81 MG tablet Take 81 mg by mouth daily.   atorvastatin 40 MG tablet Commonly known as:  LIPITOR TAKE 1 TABLET BY MOUTH DAILY What changed:    how much to take  how to take this  when to take this   metoprolol tartrate 25 MG tablet Commonly known as:  LOPRESSOR Take 1 tablet (25 mg total) by mouth 2 (two) times daily.   nitroGLYCERIN 0.4 MG SL tablet Commonly known as:  NITROSTAT Place 1 tablet (0.4 mg total) under the tongue every 5 (five) minutes as needed.   oxyCODONE-acetaminophen 5-325 MG tablet Commonly known as:  Percocet Take 1 tablet by mouth every 6 (six) hours as needed for severe pain.   oxymetazoline 0.05 % nasal spray Commonly known as:  AFRIN Place 3 sprays into both nostrils 2 (two) times daily.   warfarin 2.5 MG tablet Commonly known as:  COUMADIN Take 1 tablet (2.5 mg total) by mouth daily. Take one tablet by mouth daily or as directed  by the [facility]. Start taking on:  August 06, 2018      Follow-up Information    Alleen Borne, MD Follow up.   Specialty:  Cardiothoracic Surgery Why:  Your routine follow-up appointment is on 08/25/2018 at 1pm. Please arrive at 12:30pm for a chest xray located at [facility] which is on the first floor of our building.  Contact information: [address] [employer] [address] [place] [address] [phone]        tcts nursing appointment Follow up.   Why:  Your suture removal appointment is on 08/10/2018 at 10:00am. Contact information: Dr. Sharee Pimple office       [facility], [place], [place] Follow up.   Specialty:  Cardiology Why:  Your cardiology appointment is on 08/26/2018 at 11:00am. Please bring your medication list.  Contact information: [address] [place] [address] [phone]        [facility] [place] [employer]. Go on 08/09/2018.   Specialty:  Cardiology Why:  You hav an appointment with the [facility] on Monday August 09, 2018 at  2:30 PM Contact information: [address] [place] [address] [phone]         The patient has been discharged on:   1.Beta Blocker:  Yes [ yes  ]                              No   [   ]                              If No, reason:  2.Ace Inhibitor/ARB: Yes [   ]                                     No  [  no  ]                                     If No, reason: titration of BB  3.Statin:   Yes [ yes  ]                  No  [   ]  If No, reason:  4.Ecasa:  Yes  [  yes ]                  No   [   ]                  If No, reason:   Signed: Marvel Sapp G. Kesi Perrow 08/05/2018, 12:23 PM   Edited by Judie Petit. Lewayne Bunting 08/05/18

## 2018-08-03 NOTE — Progress Notes (Addendum)
4 Days Post-Op Procedure(s) (LRB): CORONARY ARTERY BYPASS GRAFTING (CABG)x 2  , using left internal mammary artery and right leg greater saphenous  vein harvested endoscopically (N/A) AORTIC VALVE REPLACEMENT (AVR) (N/A) TRANSESOPHAGEAL ECHOCARDIOGRAM (TEE) (N/A) Subjective: Resting in bed with his wife at the bedside.  He is awake and alert and says he is comfortable.   Objective: Vital signs in last 24 hours: Temp:  [98.1 F (36.7 C)-99 F (37.2 C)] 98.1 F (36.7 C) (03/10 0742) Pulse Rate:  [62-93] 93 (03/10 0742) Cardiac Rhythm: Normal sinus rhythm (03/10 0426) Resp:  [17-18] 17 (03/10 0742) BP: (102-135)/(67-84) 135/84 (03/10 0742) SpO2:  [91 %-97 %] 95 % (03/10 0742) Weight:  [81 kg] 81 kg (03/10 0500)    Intake/Output from previous day: 03/09 0701 - 03/10 0700 In: 250 [P.O.:250] Out: 400 [Urine:400] Intake/Output this shift: No intake/output data recorded.  General appearance: alert and cooperative Neurologic: intact Heart: irregularly irregular rhythm. Monitor shows a-fib with controlled VR. Lungs: clear to auscultation bilaterally Extremities: extremities normal, atraumatic, no cyanosis or edema Wound: incision all well approximated and healing without complication.  Lab Results: Recent Labs    08/01/18 0419 08/02/18 0236  WBC 16.4* 15.2*  HGB 10.3* 9.6*  HCT 31.0* 28.6*  PLT 131* 131*   BMET:  Recent Labs    08/01/18 0419 08/02/18 0236  NA 134* 132*  K 4.3 3.9  CL 101 99  CO2 25 25  GLUCOSE 143* 126*  BUN 22 17  CREATININE 1.24 1.03  CALCIUM 8.1* 8.0*    PT/INR: No results for input(s): LABPROT, INR in the last 72 hours. ABG    Component Value Date/Time   PHART 7.323 (L) 07/30/2018 1818   HCO3 21.8 07/30/2018 1818   TCO2 23 07/30/2018 1818   ACIDBASEDEF 4.0 (H) 07/30/2018 1818   O2SAT 98.0 07/30/2018 1818   CBG (last 3)  Recent Labs    08/02/18 1637 08/02/18 2134 08/03/18 0625  GLUCAP 130* 185* 101*    Assessment/Plan: S/P  Procedure(s) (LRB): CORONARY ARTERY BYPASS GRAFTING (CABG)x 2  , using left internal mammary artery and right leg greater saphenous  vein harvested endoscopically (N/A) AORTIC VALVE REPLACEMENT (AVR) (N/A) TRANSESOPHAGEAL ECHOCARDIOGRAM (TEE) (N/A)  -POD5 CABG. Excellent progress with mobility, Maintaining O2 sats on RA.   -Post -op atrial fibrillation in setting of chronic LBBB.  Currently on amiodarone 200mg  po BID and metoprolol 12.5mg  PO BID. Titrating antiarrhythmics slowly due to h/o bundle block.  Will anticoagulate with Coumadin.  Pacer wires remain in place.  -Volume excess-Wt still several pounds above pre-op. No peripheral edema. On Lasix 40mg  po daily.  -Expected acute blood loss anemia-hemoglobin is been reasonably stable.  Recheck in a.m.  -DVT PPX- continue enoxaparin, encourage ambulation.    LOS: 4 days    Leary Roca, Cordelia Poche [phone] 08/03/2018   Chart reviewed, patient examined, agree with above. He was in sinus early this am with frequent PAC's but back in atrial fib now in the 90's. Will increase Lopressor to 25 bid since BP better. Will continue amio 200 bid for now and expect this to be more effective over time. He feels well and is ambulating well. Will start Coumadin today. Wt is still 8 lbs over preop. Continue diuresis.

## 2018-08-03 NOTE — Care Management Important Message (Signed)
Important Message  Patient Details  Name: Joseph Morse MRN: [id number] Date of Birth: 1950/12/23   Medicare Important Message Given:  Yes    Latiana Tomei P Doretta Remmert 08/03/2018, 5:57 PM

## 2018-08-04 LAB — BASIC METABOLIC PANEL
Anion gap: 8 (ref 5–15)
BUN: 19 mg/dL (ref 8–23)
CO2: 26 mmol/L (ref 22–32)
Calcium: 8.2 mg/dL — ABNORMAL LOW (ref 8.9–10.3)
Chloride: 103 mmol/L (ref 98–111)
Creatinine, Ser: 1.09 mg/dL (ref 0.61–1.24)
GFR calc Af Amer: >60 mL/min (ref >60)
GFR calc non Af Amer: >60 mL/min (ref >60)
Glucose, Bld: 122 mg/dL — ABNORMAL HIGH (ref 70–99)
Potassium: 4.1 mmol/L (ref 3.5–5.1)
Sodium: 137 mmol/L (ref 135–145)

## 2018-08-04 LAB — CBC
HCT: 27.8 % — ABNORMAL LOW (ref 39.0–52.0)
Hemoglobin: 9 g/dL — ABNORMAL LOW (ref 13.0–17.0)
MCH: 29.6 pg (ref 26.0–34.0)
MCHC: 32.4 g/dL (ref 30.0–36.0)
MCV: 91.4 fL (ref 80.0–100.0)
Platelets: 205 10*3/uL (ref 150–400)
RBC: 3.04 MIL/uL — ABNORMAL LOW (ref 4.22–5.81)
RDW: 13.5 % (ref 11.5–15.5)
WBC: 9.8 10*3/uL (ref 4.0–10.5)
nRBC: 0 % (ref 0.0–0.2)

## 2018-08-04 LAB — PROTIME-INR
INR: 1.2 (ref 0.8–1.2)
Prothrombin Time: 15.5 seconds — ABNORMAL HIGH (ref 11.4–15.2)

## 2018-08-04 MED ORDER — FLUTICASONE PROPIONATE 50 MCG/ACT NA SUSP
2.0000 | NASAL | Status: DC | PRN
  Administered 2018-08-04: 2 via NASAL
  Filled 2018-08-04: qty 16

## 2018-08-04 MED ORDER — WARFARIN SODIUM 2.5 MG PO TABS
2.5000 mg | ORAL_TABLET | Freq: Every day | ORAL | Status: DC
  Administered 2018-08-04 – 2018-08-05 (×2): 2.5 mg via ORAL
  Filled 2018-08-04 (×2): qty 1

## 2018-08-04 NOTE — Progress Notes (Signed)
CARDIAC REHAB PHASE I   Pt seen ambulating independently in hallway with walker, peak HR 76. Pt denies pain or SOB. D/c education completed with pt and wife. Pt educated on importance of showers and monitoring incisions daily. Encouraged continued IS use, walks, and sternal precautions. Pt given in-the-tube sheet, and heart healthy diet. Reviewed restrictions and exercise guidelines. Offered CRP II, pt declining at this time.   [id number] [name], RN BSN 08/04/2018 9:33 AM

## 2018-08-04 NOTE — Progress Notes (Signed)
Epicardial pacing wires removed, per order. Pt tolerated well. Bedrest x1 hour. Vitals stable and being monitored per protocol. Will continue to monitor.

## 2018-08-04 NOTE — Progress Notes (Addendum)
5 Days Post-Op Procedure(s) (LRB): CORONARY ARTERY BYPASS GRAFTING (CABG)x 2  , using left internal mammary artery and right leg greater saphenous  vein harvested endoscopically (N/A) AORTIC VALVE REPLACEMENT (AVR) (N/A) TRANSESOPHAGEAL ECHOCARDIOGRAM (TEE) (N/A) Subjective: Says he is felling well and is comfortable. Doing well with mobility.  No new problems.   Objective: Vital signs in last 24 hours: Temp:  [97.8 F (36.6 C)-98.4 F (36.9 C)] 97.8 F (36.6 C) (03/11 0809) Pulse Rate:  [64-90] 64 (03/11 0809) Cardiac Rhythm: Bundle branch block;Heart block (03/11 0719) Resp:  [18-19] 18 (03/11 0809) BP: (120-138)/(65-77) 121/65 (03/11 0809) SpO2:  [94 %-99 %] 99 % (03/11 0809) Weight:  [80.2 kg] 80.2 kg (03/11 0425)    Intake/Output from previous day: 03/10 0701 - 03/11 0700 In: 323 [P.O.:320; I.V.:3] Out: -  Intake/Output this shift: No intake/output data recorded.  General appearance:alert and cooperative Neurologic:intact Heart: SR with first degree AVB with periods of bigeminal PAC's Lungs:clear to auscultation bilaterally Extremities:extremities normal, no cyanosis or edema Wound:incisions all well approximated and healing without complication.  Lab Results: Recent Labs    08/02/18 0236 08/04/18 0342  WBC 15.2* 9.8  HGB 9.6* 9.0*  HCT 28.6* 27.8*  PLT 131* 205   BMET:  Recent Labs    08/02/18 0236 08/04/18 0342  NA 132* 137  K 3.9 4.1  CL 99 103  CO2 25 26  GLUCOSE 126* 122*  BUN 17 19  CREATININE 1.03 1.09  CALCIUM 8.0* 8.2*    PT/INR:  Recent Labs    08/04/18 0342  LABPROT 15.5*  INR 1.2   ABG    Component Value Date/Time   PHART 7.323 (L) 07/30/2018 1818   HCO3 21.8 07/30/2018 1818   TCO2 23 07/30/2018 1818   ACIDBASEDEF 4.0 (H) 07/30/2018 1818   O2SAT 98.0 07/30/2018 1818   CBG (last 3)  Recent Labs    08/02/18 1637 08/02/18 2134 08/03/18 0625  GLUCAP 130* 185* 101*    Assessment/Plan: S/P Procedure(s)  (LRB): CORONARY ARTERY BYPASS GRAFTING (CABG)x 2  , using left internal mammary artery and right leg greater saphenous  vein harvested endoscopically (N/A) AORTIC VALVE REPLACEMENT (AVR) (N/A) TRANSESOPHAGEAL ECHOCARDIOGRAM (TEE) (N/A)  -POD6 CABG. Excellent progress with mobility, Maintaining O2 sats on RA.   -Post -op atrial fibrillation in setting of chronic LBBB. Now in SR with 1st degree AVB and periods of bigeminal PAC's.  Currently on amiodarone 200mg  po BID and metoprolol 12.5mg  PO BID. Will continue antiarrhythmics without change in dosing today. Continue anticoagulation with Coumadin. INR 1.2 after Coumadin 2.5mg  x 1 dose.  Remove pacer wires today.  -Volume excess-continue Lasix 40mg  po daily.  -Expected acute blood loss anemia-hemoglobin has been reasonably stable.  Recheck in a.m.  -DVT PPX- continue enoxaparin, encourage ambulation.     LOS: 5 days    Parke Poisson [phone] 08/04/2018   Chart reviewed, patient examined, agree with above. He feels well. If rhythm is stable today we will send him home tomorrow. Continue Coumadin 2.5 mg daily and need to arrange INR follow up in the anticoagulation clinic.

## 2018-08-04 NOTE — Discharge Instructions (Signed)
Discharge Instructions:  1. You may shower, please wash incisions daily with soap and water and keep dry.  If you wish to cover wounds with dressing you may do so but please keep clean and change daily.  No tub baths or swimming until incisions have completely healed.  If your incisions become red or develop any drainage please call our office at [phone]  2. No Driving until cleared by our office and you are no longer using narcotic pain medications  3. Monitor your weight daily.. Please use the same scale and weigh at same time... If you gain 3-5 lbs in 48 hours with associated lower extremity swelling, please contact our office at [phone]  4. Fever of 101.5 for at least 24 hours, please contact our office at [phone]  5. Activity- up as tolerated, please walk at least 3 times per day.  Avoid strenuous activity, no lifting, pushing, or pulling with your arms over 8-10 lbs for a minimum of 6 weeks  6. If any questions or concerns arise, please do not hesitate to contact our office at [phone]  Information on my medicine - Coumadin   (Warfarin)  This medication education was reviewed with me or my healthcare representative as part of my discharge preparation.  The pharmacist that spoke with me during my hospital stay was:  Nunzio Cobbs, Student-PharmD  Why was Coumadin prescribed for you? Coumadin was prescribed for you because you have a blood clot or a medical condition that can cause an increased risk of forming blood clots. Blood clots can cause serious health problems by blocking the flow of blood to the heart, lung, or brain. Coumadin can prevent harmful blood clots from forming. As a reminder your indication for Coumadin is:   Stroke Prevention Because Of Atrial Fibrillation  What test will check on my response to Coumadin? While on Coumadin (warfarin) you will need to have an INR test regularly to ensure that your dose is keeping you in the desired range. The INR  (international normalized ratio) number is calculated from the result of the laboratory test called prothrombin time (PT).  If an INR APPOINTMENT HAS NOT ALREADY BEEN MADE FOR YOU please schedule an appointment to have this lab work done by your health care provider within 7 days. Your INR goal is usually a number between:  2 to 3 or your provider may give you a more narrow range like 2-2.5.  Ask your health care provider during an office visit what your goal INR is.  What  do you need to  know  About  COUMADIN? Take Coumadin (warfarin) exactly as prescribed by your healthcare provider about the same time each day.  DO NOT stop taking without talking to the doctor who prescribed the medication.  Stopping without other blood clot prevention medication to take the place of Coumadin may increase your risk of developing a new clot or stroke.  Get refills before you run out.  What do you do if you miss a dose? If you miss a dose, take it as soon as you remember on the same day then continue your regularly scheduled regimen the next day.  Do not take two doses of Coumadin at the same time.  Important Safety Information A possible side effect of Coumadin (Warfarin) is an increased risk of bleeding. You should call your healthcare provider right away if you experience any of the following: ? Bleeding from an injury or your nose that does not stop. ? Unusual colored urine (  red or dark brown) or unusual colored stools (red or black). ? Unusual bruising for unknown reasons. ? A serious fall or if you hit your head (even if there is no bleeding).  Some foods or medicines interact with Coumadin (warfarin) and might alter your response to warfarin. To help avoid this: ? Eat a balanced diet, maintaining a consistent amount of Vitamin K. ? Notify your provider about major diet changes you plan to make. ? Avoid alcohol or limit your intake to 1 drink for women and 2 drinks for men per day. (1 drink is 5 oz.  wine, 12 oz. beer, or 1.5 oz. liquor.)  Make sure that ANY health care provider who prescribes medication for you knows that you are taking Coumadin (warfarin).  Also make sure the healthcare provider who is monitoring your Coumadin knows when you have started a new medication including herbals and non-prescription products.  Coumadin (Warfarin)  Major Drug Interactions  Increased Warfarin Effect Decreased Warfarin Effect  Alcohol (large quantities) Antibiotics (esp. Septra/Bactrim, Flagyl, Cipro) Amiodarone (Cordarone) Aspirin (ASA) Cimetidine (Tagamet) Megestrol (Megace) NSAIDs (ibuprofen, naproxen, etc.) Piroxicam (Feldene) Propafenone (Rythmol SR) Propranolol (Inderal) Isoniazid (INH) Posaconazole (Noxafil) Barbiturates (Phenobarbital) Carbamazepine (Tegretol) Chlordiazepoxide (Librium) Cholestyramine (Questran) Griseofulvin Oral Contraceptives Rifampin Sucralfate (Carafate) Vitamin K   Coumadin (Warfarin) Major Herbal Interactions  Increased Warfarin Effect Decreased Warfarin Effect  Garlic Ginseng Ginkgo biloba Coenzyme Q10 Green tea St. Johns wort    Coumadin (Warfarin) FOOD Interactions  Eat a consistent number of servings per week of foods HIGH in Vitamin K (1 serving =  cup)  Collards (cooked, or boiled & drained) Kale (cooked, or boiled & drained) Mustard greens (cooked, or boiled & drained) Parsley *serving size only =  cup Spinach (cooked, or boiled & drained) Swiss chard (cooked, or boiled & drained) Turnip greens (cooked, or boiled & drained)  Eat a consistent number of servings per week of foods MEDIUM-HIGH in Vitamin K (1 serving = 1 cup)  Asparagus (cooked, or boiled & drained) Broccoli (cooked, boiled & drained, or raw & chopped) Brussel sprouts (cooked, or boiled & drained) *serving size only =  cup Lettuce, raw (green leaf, endive, romaine) Spinach, raw Turnip greens, raw & chopped   These websites have more information on Coumadin  (warfarin):  http://www.king-russell.com/; https://www.hines.net/;

## 2018-08-05 LAB — PROTIME-INR
INR: 1.4 — ABNORMAL HIGH (ref 0.8–1.2)
Prothrombin Time: 17 seconds — ABNORMAL HIGH (ref 11.4–15.2)

## 2018-08-05 MED ORDER — ASPIRIN 325 MG PO TBEC: 325.0000 mg | DELAYED_RELEASE_TABLET | Freq: Every day | ORAL | 0 refills | Status: DC

## 2018-08-05 MED ORDER — WARFARIN SODIUM 2.5 MG PO TABS: 2.5000 mg | ORAL_TABLET | Freq: Every day | ORAL | 3 refills | Status: DC

## 2018-08-05 MED ORDER — OXYCODONE-ACETAMINOPHEN 5-325 MG PO TABS: 1.0000 | ORAL_TABLET | Freq: Four times a day (QID) | ORAL | 0 refills | Status: DC | PRN

## 2018-08-05 MED ORDER — METOPROLOL TARTRATE 25 MG PO TABS: 25.0000 mg | ORAL_TABLET | Freq: Two times a day (BID) | ORAL | 2 refills | Status: DC

## 2018-08-05 MED ORDER — AMIODARONE HCL 200 MG PO TABS: 200.0000 mg | ORAL_TABLET | Freq: Two times a day (BID) | ORAL | 1 refills | Status: DC

## 2018-08-05 NOTE — Progress Notes (Signed)
6 Days Post-Op Procedure(s) (LRB): CORONARY ARTERY BYPASS GRAFTING (CABG)x 2  , using left internal mammary artery and right leg greater saphenous  vein harvested endoscopically (N/A) AORTIC VALVE REPLACEMENT (AVR) (N/A) TRANSESOPHAGEAL ECHOCARDIOGRAM (TEE) (N/A) Subjective: No new problems. Independent with mobility.  Pain well controlled.  Objective: Vital signs in last 24 hours: Temp:  [98 F (36.7 C)-98.2 F (36.8 C)] 98.2 F (36.8 C) (03/12 0438) Pulse Rate:  [56-71] 63 (03/11 2340) Cardiac Rhythm: Heart block (03/12 0438) Resp:  [16-18] 18 (03/12 0438) BP: (126-155)/(66-90) 139/75 (03/11 2340) SpO2:  [96 %-100 %] 96 % (03/12 0438) Weight:  [79.8 kg] 79.8 kg (03/12 0500)     Intake/Output from previous day: 03/11 0701 - 03/12 0700 In: 3 [I.V.:3] Out: -  Intake/Output this shift: No intake/output data recorded.   General appearance: alert and cooperative  Neurologic: intact  Heart: Monitor shows mostly SR with a brief episode of Afib last night. Lungs: clear to auscultation bilaterally  Extremities: extremities normal, no cyanosis or edema  Wound: incisions all well approximated and healing without complication.    Lab Results: Recent Labs    08/04/18 0342  WBC 9.8  HGB 9.0*  HCT 27.8*  PLT 205   BMET:  Recent Labs    08/04/18 0342  NA 137  K 4.1  CL 103  CO2 26  GLUCOSE 122*  BUN 19  CREATININE 1.09  CALCIUM 8.2*    PT/INR:  Recent Labs    08/05/18 0444  LABPROT 17.0*  INR 1.4*   ABG    Component Value Date/Time   PHART 7.323 (L) 07/30/2018 1818   HCO3 21.8 07/30/2018 1818   TCO2 23 07/30/2018 1818   ACIDBASEDEF 4.0 (H) 07/30/2018 1818   O2SAT 98.0 07/30/2018 1818   CBG (last 3)  Recent Labs    08/02/18 1637 08/02/18 2134 08/03/18 0625  GLUCAP 130* 185* 101*    Assessment/Plan: S/P Procedure(s) (LRB): CORONARY ARTERY BYPASS GRAFTING (CABG)x 2  , using left internal mammary artery and right leg greater saphenous  vein harvested  endoscopically (N/A) AORTIC VALVE REPLACEMENT (AVR) (N/A) TRANSESOPHAGEAL ECHOCARDIOGRAM (TEE) (N/A)  -POD7 CABG. Excellent progress with mobility, Maintaining O2 sats on RA. He feels ready for discharge to home.  -Post -op atrial fibrillation in setting of chronic LBBB. Now in fairly stable  SR with 1st degree AVB. Remains on amiodarone 200mg  po BID and metoprolol 12.5mg  PO BID. Will continue antiarrhythmics without change in dosing. Continue anticoagulation with Coumadin 2.5mg  daily. INR 1.4 after Coumadin 2.5mg  x 3 doses.   -Expected acute blood loss anemia-hemoglobin has been reasonably stable.   -Discharge today. Follow up in Coumadin Clinic Monday 3/16. Suture removal in our office next week.     LOS: 6 days    Leary Roca, PA-C 08/05/2018

## 2018-08-05 NOTE — Progress Notes (Signed)
CARDIAC REHAB PHASE I   Pt ready and anxious to d/c. Pt and wife deny questions or concerns. Stressed importance of monitoring symptoms related to Afib. Also reviewed S&S of bleeding. Pt declining CRP II at this time.  [id number] [name], RN BSN 08/05/2018 11:10 AM

## 2018-08-06 MED FILL — Sodium Bicarbonate IV Soln 8.4%: INTRAVENOUS | Qty: 50 | Status: AC

## 2018-08-06 MED FILL — Mannitol IV Soln 20%: INTRAVENOUS | Qty: 500 | Status: AC

## 2018-08-06 MED FILL — Heparin Sodium (Porcine) Inj 1000 Unit/ML: INTRAMUSCULAR | Qty: 10 | Status: AC

## 2018-08-06 MED FILL — Lidocaine HCl(Cardiac) IV PF Soln Pref Syr 100 MG/5ML (2%): INTRAVENOUS | Qty: 5 | Status: AC

## 2018-08-06 MED FILL — Thrombin (Recombinant) For Soln 20000 Unit: CUTANEOUS | Qty: 1 | Status: AC

## 2018-08-06 MED FILL — Electrolyte-R (PH 7.4) Solution: INTRAVENOUS | Qty: 3000 | Status: AC

## 2018-08-06 MED FILL — Sodium Chloride IV Soln 0.9%: INTRAVENOUS | Qty: 2000 | Status: AC

## 2018-08-09 ENCOUNTER — Ambulatory Visit (INDEPENDENT_AMBULATORY_CARE_PROVIDER_SITE_OTHER): Payer: Medicare Other

## 2018-08-09 ENCOUNTER — Other Ambulatory Visit: Payer: Self-pay

## 2018-08-09 DIAGNOSIS — Z7901 Long term (current) use of anticoagulants: Secondary | ICD-10-CM | POA: Diagnosis not present

## 2018-08-09 DIAGNOSIS — Z951 Presence of aortocoronary bypass graft: Secondary | ICD-10-CM | POA: Diagnosis not present

## 2018-08-09 DIAGNOSIS — I4891 Unspecified atrial fibrillation: Secondary | ICD-10-CM

## 2018-08-09 LAB — POCT INR: INR: 3.3 — AB (ref 2.0–3.0)

## 2018-08-09 MED FILL — Potassium Chloride Inj 2 mEq/ML: INTRAVENOUS | Qty: 40 | Status: AC

## 2018-08-09 MED FILL — Heparin Sodium (Porcine) Inj 1000 Unit/ML: INTRAMUSCULAR | Qty: 30 | Status: AC

## 2018-08-09 MED FILL — Magnesium Sulfate Inj 50%: INTRAMUSCULAR | Qty: 10 | Status: AC

## 2018-08-09 NOTE — Patient Instructions (Addendum)
Description   Skip tomorrow's dosage of Coumadin, then start taking 1 tablet daily except 1/2 tablet on Mondays, wednesdays and Fridays.  Recheck in 1 week. Coumadin Clinic [phone].   A full discussion of the nature of anticoagulants has been carried out.  A benefit risk analysis has been presented to the patient, so that they understand the justification for choosing anticoagulation at this time. The need for frequent and regular monitoring, precise dosage adjustment and compliance is stressed.  Side effects of potential bleeding are discussed.  The patient should avoid any OTC items containing aspirin or ibuprofen, and should avoid great swings in general diet.  Avoid alcohol consumption.  Call if any signs of abnormal bleeding.

## 2018-08-10 ENCOUNTER — Encounter (INDEPENDENT_AMBULATORY_CARE_PROVIDER_SITE_OTHER): Payer: Self-pay

## 2018-08-10 DIAGNOSIS — Z4802 Encounter for removal of sutures: Secondary | ICD-10-CM

## 2018-08-16 ENCOUNTER — Ambulatory Visit (INDEPENDENT_AMBULATORY_CARE_PROVIDER_SITE_OTHER): Payer: Medicare Other | Admitting: *Deleted

## 2018-08-16 ENCOUNTER — Other Ambulatory Visit: Payer: Self-pay

## 2018-08-16 ENCOUNTER — Telehealth: Payer: Self-pay | Admitting: Physician Assistant

## 2018-08-16 DIAGNOSIS — I4891 Unspecified atrial fibrillation: Secondary | ICD-10-CM

## 2018-08-16 DIAGNOSIS — Z7901 Long term (current) use of anticoagulants: Secondary | ICD-10-CM | POA: Diagnosis not present

## 2018-08-16 DIAGNOSIS — Z951 Presence of aortocoronary bypass graft: Secondary | ICD-10-CM | POA: Diagnosis not present

## 2018-08-16 LAB — POCT INR: INR: 3.7 — AB (ref 2.0–3.0)

## 2018-08-16 NOTE — Progress Notes (Deleted)
Spoke with patient over phone. Reviewed my last OV note and surgical report. No cardiac symptoms. He is doing elliptical exercise 30 minutes/day without any issue.   Due to COVID19 outbreak will reschedule appointment in 6-8 weeks. Patient is agree with plan. Will call us soon if any concern.

## 2018-08-16 NOTE — Patient Instructions (Signed)
Description   Skip tomorrow's dosage of Coumadin, then start taking 1/2 tablet daily except 1 tablet on Sundays, Tuesdays, and Thursdays.  Recheck in 1 week. Coumadin Clinic [phone].

## 2018-08-16 NOTE — Telephone Encounter (Signed)
Spoke with patient over phone. Reviewed my last OV note and surgical report. No cardiac symptoms. He is doing elliptical exercise 30 minutes/day without any issue.   Due to COVID19 outbreak will reschedule appointment in 6-8 weeks. Patient is agree with plan. Will call us soon if any concern.  

## 2018-08-19 NOTE — Telephone Encounter (Signed)
   Primary Cardiologist:  No primary care provider on file.   Patient contacted.  History reviewed.  No symptoms to suggest any unstable cardiac conditions.  Based on discussion, with current pandemic situation, we will be postponing this appointment for Joseph Morse with a plan for f/u in 8 wks or sooner if feasible/necessary.  If symptoms change, he has been instructed to contact our office.   Routing to C19 CANCEL pool for tracking (P CV DIV CV19 CANCEL - reason for visit "other.") and assigning priority (1 = 4-6 wks, 2 = 6-12 wks, 3 = >12 wks).   [place], [place]  08/19/2018 12:17 PM         .

## 2018-08-23 ENCOUNTER — Ambulatory Visit (INDEPENDENT_AMBULATORY_CARE_PROVIDER_SITE_OTHER): Payer: Medicare Other | Admitting: *Deleted

## 2018-08-23 ENCOUNTER — Other Ambulatory Visit: Payer: Self-pay

## 2018-08-23 DIAGNOSIS — I4891 Unspecified atrial fibrillation: Secondary | ICD-10-CM | POA: Diagnosis not present

## 2018-08-23 DIAGNOSIS — Z951 Presence of aortocoronary bypass graft: Secondary | ICD-10-CM | POA: Diagnosis not present

## 2018-08-23 DIAGNOSIS — Z7901 Long term (current) use of anticoagulants: Secondary | ICD-10-CM | POA: Diagnosis not present

## 2018-08-23 LAB — POCT INR: INR: 2.3 (ref 2.0–3.0)

## 2018-08-24 ENCOUNTER — Other Ambulatory Visit: Payer: Self-pay

## 2018-08-24 ENCOUNTER — Other Ambulatory Visit: Payer: Self-pay | Admitting: Surgery

## 2018-08-24 ENCOUNTER — Telehealth: Payer: Self-pay

## 2018-08-24 DIAGNOSIS — Z951 Presence of aortocoronary bypass graft: Secondary | ICD-10-CM

## 2018-08-24 NOTE — Telephone Encounter (Signed)
Virtual Visit Pre-Appointment Phone Call    TELEPHONE CALL NOTE  Joseph Morse has been deemed a candidate for a follow-up tele-health visit to limit community exposure during the Covid-19 pandemic. I spoke with the patient via phone to ensure availability of phone/video source, confirm preferred email & phone number, and discuss instructions and expectations.  I reminded Joseph Morse to be prepared with any vital sign and/or heart rhythm information that could potentially be obtained via home monitoring, at the time of his visit. I reminded Joseph Morse to expect a phone call at the time of his visit if his visit.  Did the patient verbally acknowledge consent to treatment? YES  Patient scheduled for VIDEO Visit with Dr. Eldridge Dace on 4/1  Lattie Haw, RN 08/24/2018 5:45 PM   DOWNLOADING THE WEBEX SOFTWARE TO SMARTPHONE  - If Apple, go to [employer] and type in WebEx in the search bar. Download [employer], the blue/green circle. The app is free but as with any other app downloads, their phone may require them to verify saved payment information or Apple password. The patient does NOT have to create an account.  - If Android, ask patient to go to [employer] and type in WebEx in the search bar. Download [employer], the blue/green circle. The app is free but as with any other app downloads, their phone may require them to verify saved payment information or Android password. The patient does NOT have to create an account.   CONSENT FOR TELE-HEALTH VISIT - PLEASE REVIEW  I hereby voluntarily request, consent and authorize [facility] and its employed or contracted physicians, physician assistants, nurse practitioners or other licensed health care professionals (the Practitioner), to provide me with telemedicine health care services (the "Services") as deemed necessary by the treating Practitioner. I acknowledge and consent to receive the Services  by the Practitioner via telemedicine. I understand that the telemedicine visit will involve communicating with the Practitioner through live audiovisual communication technology and the disclosure of certain medical information by electronic transmission. I acknowledge that I have been given the opportunity to request an in-person assessment or other available alternative prior to the telemedicine visit and am voluntarily participating in the telemedicine visit.  I understand that I have the right to withhold or withdraw my consent to the use of telemedicine in the course of my care at any time, without affecting my right to future care or treatment, and that the Practitioner or I may terminate the telemedicine visit at any time. I understand that I have the right to inspect all information obtained and/or recorded in the course of the telemedicine visit and may receive copies of available information for a reasonable fee.  I understand that some of the potential risks of receiving the Services via telemedicine include:  Marland Kitchen Delay or interruption in medical evaluation due to technological equipment failure or disruption; . Information transmitted may not be sufficient (e.g. poor resolution of images) to allow for appropriate medical decision making by the Practitioner; and/or  . In rare instances, security protocols could fail, causing a breach of personal health information.  Furthermore, I acknowledge that it is my responsibility to provide information about my medical history, conditions and care that is complete and accurate to the best of my ability. I acknowledge that Practitioner's advice, recommendations, and/or decision may be based on factors not within their control, such as incomplete or inaccurate data provided by me or distortions of  diagnostic images or specimens that may result from electronic transmissions. I understand that the practice of medicine is not an exact science and that Practitioner  makes no warranties or guarantees regarding treatment outcomes. I acknowledge that I will receive a copy of this consent concurrently upon execution via email to the email address I last provided but may also request a printed copy by calling the office of [facility].    I understand that my insurance will be billed for this visit.   I have read or had this consent read to me. . I understand the contents of this consent, which adequately explains the benefits and risks of the Services being provided via telemedicine.  . I have been provided ample opportunity to ask questions regarding this consent and the Services and have had my questions answered to my satisfaction. . I give my informed consent for the services to be provided through the use of telemedicine in my medical care  By participating in this telemedicine visit I agree to the above.

## 2018-08-24 NOTE — Telephone Encounter (Signed)
Patient scheduled for VIDEO Visit with Dr. Varanasi on 4/1 

## 2018-08-24 NOTE — Progress Notes (Signed)
Virtual Visit via Video Note    Evaluation Performed:  Follow-up visit  This visit type was conducted due to national recommendations for restrictions regarding the COVID-19 Pandemic (e.g. social distancing).  This format is felt to be most appropriate for this patient at this time.  All issues noted in this document were discussed and addressed.  No physical exam was performed (except for noted visual exam findings with Video Visits).  Please refer to the patient's chart (MyChart message for video visits and phone note for telephone visits) for the patient's consent to telehealth for [facility].  Date:  08/25/2018   ID:  Joseph Morse, DOB [birth year], MRN [id number]  Patient Location:  Home  Provider location:   [place], [place]  PCP:  [name], MD  Cardiologist:  No primary care provider on file. [place] Electrophysiologist:  None   Chief Complaint:  CAD/ AVR  History of Present Illness:    Joseph Morse is a 68 y.o. male who presents via audio/video conferencing for a telehealth visit today.   In 07/2018, he had:  "Coronary artery bypass grafting x 2  Sequential SVG to OM 1 and OM2 Aortic valve replacement using a 25 mm Edwards INSPIRIS RESILIA pericardial valve."  He had PAF post op.  He had sx with that.  He could feel an irregularity, but not too fast.  The patient does not have symptoms concerning for COVID-19 infection (fever, chills, cough, or new shortness of breath).   Denies : Chest pain. Dizziness. Leg edema. Nitroglycerin use. Orthopnea. Palpitations. Paroxysmal nocturnal dyspnea. Shortness of breath. Syncope.   He has been walking more in the neighborhood without difficulty.  He walked 3.5 miles a few days ago.  Feeling much better after surgery.  Pain well controlled.   Using Apple watch to monitor heart rate as well.      Prior CV studies:   The following studies were reviewed today:  2020 cath showing:  Mid RCA stent is patent with  mild in-stent restenosis  Prox RCA lesion is 30% stenosed.  Ost RPDA lesion is 25% stenosed.  Prox Cx lesion is 80% stenosed. This lesion appears unchanged from the 2010 cardiac cath.  Ost Cx to Prox Cx lesion is 50% stenosed.  Mid Cx to Dist Cx lesion is 90% stenosed. This lesion appears unchanged from the 2010 cardiac cath.  2nd Mrg lesion is 75% stenosed. Lat 2nd Mrg lesion is 75% stenosed. This bifurcation area in the OM represents new disease compared to 2010.  Prox LAD lesion is 25% stenosed.  Past Medical History:  Diagnosis Date  . Chest pain   . Coronary artery disease   . Dyspnea   . Headache    migraines  . Hernia, abdominal    x 2  . History of kidney stones   . HTN (hypertension)   . Hyperlipidemia, mixed   . Post-nasal drip   . S/P right coronary artery (RCA) stent placement   . Severe aortic stenosis    Past Surgical History:  Procedure Laterality Date  . AORTIC VALVE REPLACEMENT N/A 07/30/2018   Procedure: AORTIC VALVE REPLACEMENT (AVR);  Surgeon: Alleen Borne, MD;  Location: [facility] OR;  Service: Open Heart Surgery;  Laterality: N/A;  . CORONARY ARTERY BYPASS GRAFT N/A 07/30/2018   Procedure: CORONARY ARTERY BYPASS GRAFTING (CABG)x 2  , using left internal mammary artery and right leg greater saphenous  vein harvested endoscopically;  Surgeon: Alleen Borne, MD;  Location: [facility] OR;  Service: Open Heart Surgery;  Laterality: N/A;  . HERNIA REPAIR     B/L inguinal hernia  . RIGHT HEART CATH AND CORONARY ANGIOGRAPHY N/A 07/13/2018   Procedure: RIGHT HEART CATH AND CORONARY ANGIOGRAPHY;  Surgeon: Corky Crafts, MD;  Location: [facility] INVASIVE CV LAB;  Service: Cardiovascular;  Laterality: N/A;  . TEE WITHOUT CARDIOVERSION N/A 07/30/2018   Procedure: TRANSESOPHAGEAL ECHOCARDIOGRAM (TEE);  Surgeon: Alleen Borne, MD;  Location: [facility] OR;  Service: Open Heart Surgery;  Laterality: N/A;  . TONSILLECTOMY       Current Meds  Medication Sig  . amiodarone (PACERONE) 200  MG tablet Take 1 tablet (200 mg total) by mouth 2 (two) times daily.  Marland Kitchen amoxicillin (AMOXIL) 500 MG capsule Take 4 capsules 1 hour prior to dental procedures  . aspirin EC 81 MG tablet Take 81 mg by mouth daily.  Marland Kitchen atorvastatin (LIPITOR) 40 MG tablet TAKE 1 TABLET BY MOUTH DAILY  . metoprolol tartrate (LOPRESSOR) 25 MG tablet Take 1 tablet (25 mg total) by mouth 2 (two) times daily.  . nitroGLYCERIN (NITROSTAT) 0.4 MG SL tablet Place 1 tablet (0.4 mg total) under the tongue every 5 (five) minutes as needed.  Marland Kitchen oxyCODONE-acetaminophen (PERCOCET) 5-325 MG tablet Take 1 tablet by mouth every 6 (six) hours as needed for severe pain.  Marland Kitchen oxymetazoline (AFRIN) 0.05 % nasal spray Place 3 sprays into both nostrils 2 (two) times daily.  Marland Kitchen warfarin (COUMADIN) 2.5 MG tablet Take 1 tablet (2.5 mg total) by mouth daily. Take one tablet by mouth daily or as directed by the Coumadin Clinic.     Allergies:   Patient has no known allergies.   Social History   Tobacco Use  . Smoking status: Former Smoker    Types: Cigarettes  . Smokeless tobacco: Never Used  . Tobacco comment:  " Quit smoking cigarettes in my 30's "  Substance Use Topics  . Alcohol use: No    Frequency: Never  . Drug use: No     Family Hx: The patient's family history includes CVA in his mother; Cancer in his maternal grandfather; Heart attack in his father; Hyperlipidemia in his sister.  ROS:   Please see the history of present illness.     All other systems reviewed and are negative.   Labs/Other Tests and Data Reviewed:    Recent Labs: 07/28/2018: ALT 23 07/31/2018: Magnesium 2.4 08/04/2018: BUN 19; Creatinine, Ser 1.09; Hemoglobin 9.0; Platelets 205; Potassium 4.1; Sodium 137   Recent Lipid Panel Lab Results  Component Value Date/Time   CHOL 103 04/24/2016 09:11 AM   TRIG 46 04/24/2016 09:11 AM   HDL 41 04/24/2016 09:11 AM   CHOLHDL 2.5 04/24/2016 09:11 AM   LDLCALC 53 04/24/2016 09:11 AM    Wt Readings from Last 3  Encounters:  08/05/18 176 lb (79.8 kg)  07/28/18 170 lb 11.2 oz (77.4 kg)  07/15/18 174 lb 3.2 oz (79 kg)     Objective:    Vital Signs:  Ht 5' 9.5" (1.765 m)   BMI 25.62 kg/m    Well nourished, well developed male in no acute distress. Appears to be breathing normally.   ASSESSMENT & PLAN:    1.  CAD: s/p RCA stent.  CABG with SVG to OM2/OM3.  COntinue aggressive secondary prevention.  Will need lipids checked at a later time.   2. S/p AVR for severe aortic stenosis in the setting of bicuspid aortic valve.  Needs SBE prophylaxis. 3.  PAF:  Decrease  Amio to 200 mg daily.  COntinue Coumadin for now.  At f/u in 6 weeks, may be able to stop amiodarone.   Hope to be able to stop Coumadin afte rthe three months if he is not having any more AFib.   COVID-19 Education: The signs and symptoms of COVID-19 were discussed with the patient and how to seek care for testing (follow up with PCP or arrange E-visit).  The importance of social distancing was discussed today.  Patient Risk:   After full review of this patient's clinical status, I feel that they are at least moderate risk at this time.  Time:   Today, I have spent 25 minutes with the patient with telehealth technology discussing PAF, post AVR care.     Medication Adjustments/Labs and Tests Ordered: Current medicines are reviewed at length with the patient today.  Concerns regarding medicines are outlined above.  Tests Ordered: No orders of the defined types were placed in this encounter.  Medication Changes: No orders of the defined types were placed in this encounter.   Disposition:  Follow up in 6 week(s)  Signed, Lance Muss, MD  08/25/2018 10:57 AM    [facility]

## 2018-08-25 ENCOUNTER — Ambulatory Visit (INDEPENDENT_AMBULATORY_CARE_PROVIDER_SITE_OTHER): Payer: Self-pay | Admitting: Surgery

## 2018-08-25 ENCOUNTER — Encounter: Payer: Self-pay | Admitting: Surgery

## 2018-08-25 ENCOUNTER — Encounter: Payer: Self-pay | Admitting: Interventional Cardiology

## 2018-08-25 ENCOUNTER — Telehealth (INDEPENDENT_AMBULATORY_CARE_PROVIDER_SITE_OTHER): Payer: Medicare Other | Admitting: Interventional Cardiology

## 2018-08-25 ENCOUNTER — Ambulatory Visit
Admission: RE | Admit: 2018-08-25 | Discharge: 2018-08-25 | Disposition: A | Discharge status: Home / Self Care | Payer: Medicare Other | Source: Ambulatory Visit | Attending: Surgery | Admitting: Surgery

## 2018-08-25 VITALS — BP 168/81 | HR 51 | Temp 97.6°F | Resp 20 | Ht 69.5 in | Wt 167.0 lb

## 2018-08-25 DIAGNOSIS — I359 Nonrheumatic aortic valve disorder, unspecified: Secondary | ICD-10-CM

## 2018-08-25 DIAGNOSIS — Z7901 Long term (current) use of anticoagulants: Secondary | ICD-10-CM | POA: Diagnosis not present

## 2018-08-25 DIAGNOSIS — E782 Mixed hyperlipidemia: Secondary | ICD-10-CM

## 2018-08-25 DIAGNOSIS — I480 Paroxysmal atrial fibrillation: Secondary | ICD-10-CM | POA: Diagnosis not present

## 2018-08-25 DIAGNOSIS — Z951 Presence of aortocoronary bypass graft: Secondary | ICD-10-CM

## 2018-08-25 DIAGNOSIS — I2510 Atherosclerotic heart disease of native coronary artery without angina pectoris: Secondary | ICD-10-CM

## 2018-08-25 MED ORDER — AMIODARONE HCL 200 MG PO TABS: 200.0000 mg | ORAL_TABLET | Freq: Every day | ORAL | 3 refills | Status: DC

## 2018-08-25 NOTE — Patient Instructions (Addendum)
Medication Instructions:  Your physician has recommended you make the following change in your medication:   DECREASE: amiodarone to 200 mg once a day  Lab work: None Ordered  If you have labs (blood work) drawn today and your tests are completely normal, you will receive your results only by: Marland Kitchen MyChart Message (if you have MyChart) OR . A paper copy in the mail If you have any lab test that is abnormal or we need to change your treatment, we will call you to review the results.  Testing/Procedures: None ordered  Follow-Up: . Follow up with Dr. Eldridge Dace for VIDEO Visit on 09/30/18 at 9:20 AM  Any Other Special Instructions Will Be Listed Below (If Applicable).

## 2018-08-25 NOTE — Progress Notes (Signed)
HPI:  Patient returns for routine postoperative follow-up having undergone coronary bypass graft surgery x2 and aortic valve replacement using a 25 mm Edwards INSPIRIS RESILIA pericardial valve on 07/30/2018. The patient's early postoperative recovery while in the hospital was notable for development of postoperative atrial fibrillation converted with amiodarone.  He had some recurrent atrial fibrillation and was sent home on Coumadin. Since hospital discharge the patient reports that he has been feeling well.  He walked about 3.5 miles yesterday without any chest pain or shortness of breath.  He said that he can now walk up hills without shortness of breath that were giving him significant symptoms before surgery.   Current Outpatient Medications  Medication Sig Dispense Refill   amiodarone (PACERONE) 200 MG tablet Take 1 tablet (200 mg total) by mouth daily. 90 tablet 3   amoxicillin (AMOXIL) 500 MG capsule Take 4 capsules 1 hour prior to dental procedures 4 capsule 3   aspirin EC 81 MG tablet Take 81 mg by mouth daily.     atorvastatin (LIPITOR) 40 MG tablet TAKE 1 TABLET BY MOUTH DAILY 90 tablet 3   metoprolol tartrate (LOPRESSOR) 25 MG tablet Take 1 tablet (25 mg total) by mouth 2 (two) times daily. 60 tablet 2   nitroGLYCERIN (NITROSTAT) 0.4 MG SL tablet Place 1 tablet (0.4 mg total) under the tongue every 5 (five) minutes as needed. 25 tablet 3   oxyCODONE-acetaminophen (PERCOCET) 5-325 MG tablet Take 1 tablet by mouth every 6 (six) hours as needed for severe pain. 20 tablet 0   oxymetazoline (AFRIN) 0.05 % nasal spray Place 3 sprays into both nostrils 2 (two) times daily.     warfarin (COUMADIN) 2.5 MG tablet Take 1 tablet (2.5 mg total) by mouth daily. Take one tablet by mouth daily or as directed by the Coumadin Clinic. 30 tablet 3   No current facility-administered medications for this visit.     Physical Exam: BP (!) 168/81    Pulse (!) 51    Temp 97.6 F (36.4 C)  (Oral)    Resp 20    Ht 5' 9.5" (1.765 m)    Wt 167 lb (75.8 kg)    SpO2 98% Comment: RA   BMI 24.31 kg/m  He looks well. Cardiac exam shows a regular rate and rhythm with normal heart sounds.  There is no murmur. Lungs are clear. The chest incision is healing well and sternum is stable. His right leg incision is healing well and there is no peripheral edema.  Diagnostic Tests:  CLINICAL DATA:  68 year old male with a history of CABG on 07/30/2018  EXAM: CHEST - 2 VIEW  COMPARISON:  Prior chest x-ray 08/02/2018  FINDINGS: Surgical changes of prior median sternotomy for multivessel CABG. Improved inspiratory volumes with significant improvement in bibasilar atelectasis. Cardiac and mediastinal contours are within normal limits. Small left and trace right pleural effusions persist. No evidence of pulmonary edema. No pneumothorax. No acute osseous abnormality.  IMPRESSION: 1. No acute cardiopulmonary process. 2. Significantly improved inspiratory volumes with near-total resolution of bibasilar atelectasis. 3. Small left and trace right residual pleural effusions.   Electronically Signed   By: Malachy Moan M.D.   On: 08/25/2018 13:20   Impression:  Overall I think he is making a great recovery following surgery.  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  He appears to be maintaining sinus  rhythm and his amiodarone was decreased to 200 mg daily by Dr. Eldridge Dace today.  If he is maintaining sinus rhythm at 6 weeks this can be discontinued and then if he continues to remain in sinus rhythm his Coumadin can be stopped at 3 months and he can be maintained on aspirin.  Plan:  He will continue to follow-up with cardiology and will contact me if he develops any problems with his incisions.   Alleen Borne, MD [facility] [phone]

## 2018-08-26 ENCOUNTER — Telehealth: Payer: Self-pay

## 2018-08-26 ENCOUNTER — Ambulatory Visit: Payer: Medicare Other | Admitting: Physician Assistant

## 2018-08-26 NOTE — Telephone Encounter (Signed)

## 2018-08-26 NOTE — Telephone Encounter (Signed)
lmom prescreen/driveup.   

## 2018-08-30 ENCOUNTER — Other Ambulatory Visit: Payer: Self-pay

## 2018-08-30 ENCOUNTER — Ambulatory Visit (INDEPENDENT_AMBULATORY_CARE_PROVIDER_SITE_OTHER): Payer: Medicare Other | Admitting: *Deleted

## 2018-08-30 DIAGNOSIS — I480 Paroxysmal atrial fibrillation: Secondary | ICD-10-CM | POA: Diagnosis not present

## 2018-08-30 DIAGNOSIS — I4891 Unspecified atrial fibrillation: Secondary | ICD-10-CM | POA: Diagnosis not present

## 2018-08-30 DIAGNOSIS — Z7901 Long term (current) use of anticoagulants: Secondary | ICD-10-CM | POA: Diagnosis not present

## 2018-08-30 DIAGNOSIS — Z951 Presence of aortocoronary bypass graft: Secondary | ICD-10-CM | POA: Diagnosis not present

## 2018-08-30 LAB — POCT INR: INR: 3 (ref 2.0–3.0)

## 2018-08-30 NOTE — Patient Instructions (Signed)
Description   Spoke with pt/wife and instructed to continue taking 1/2 tablet daily except 1 tablet on Sundays, Tuesdays, and Thursdays.  Recheck in 1 week. Coumadin Clinic [phone].

## 2018-09-03 ENCOUNTER — Telehealth: Payer: Self-pay

## 2018-09-03 NOTE — Telephone Encounter (Signed)
lmom for prescreen/drive thru 

## 2018-09-06 ENCOUNTER — Other Ambulatory Visit: Payer: Self-pay

## 2018-09-06 ENCOUNTER — Ambulatory Visit (INDEPENDENT_AMBULATORY_CARE_PROVIDER_SITE_OTHER): Payer: Medicare Other | Admitting: *Deleted

## 2018-09-06 DIAGNOSIS — I4891 Unspecified atrial fibrillation: Secondary | ICD-10-CM | POA: Diagnosis not present

## 2018-09-06 DIAGNOSIS — Z951 Presence of aortocoronary bypass graft: Secondary | ICD-10-CM | POA: Diagnosis not present

## 2018-09-06 DIAGNOSIS — Z7901 Long term (current) use of anticoagulants: Secondary | ICD-10-CM | POA: Diagnosis not present

## 2018-09-06 DIAGNOSIS — I480 Paroxysmal atrial fibrillation: Secondary | ICD-10-CM | POA: Diagnosis not present

## 2018-09-06 LAB — POCT INR: INR: 2.8 (ref 2.0–3.0)

## 2018-09-17 ENCOUNTER — Telehealth: Payer: Self-pay

## 2018-09-17 NOTE — Telephone Encounter (Signed)

## 2018-09-20 ENCOUNTER — Ambulatory Visit (INDEPENDENT_AMBULATORY_CARE_PROVIDER_SITE_OTHER): Payer: Medicare Other | Admitting: Pharmacist

## 2018-09-20 ENCOUNTER — Other Ambulatory Visit: Payer: Self-pay

## 2018-09-20 DIAGNOSIS — Z7901 Long term (current) use of anticoagulants: Secondary | ICD-10-CM | POA: Diagnosis not present

## 2018-09-20 DIAGNOSIS — Z951 Presence of aortocoronary bypass graft: Secondary | ICD-10-CM

## 2018-09-20 DIAGNOSIS — I480 Paroxysmal atrial fibrillation: Secondary | ICD-10-CM | POA: Diagnosis not present

## 2018-09-20 DIAGNOSIS — I4891 Unspecified atrial fibrillation: Secondary | ICD-10-CM

## 2018-09-20 LAB — POCT INR: INR: 2.6 (ref 2.0–3.0)

## 2018-09-30 ENCOUNTER — Other Ambulatory Visit: Payer: Self-pay

## 2018-09-30 ENCOUNTER — Telehealth (INDEPENDENT_AMBULATORY_CARE_PROVIDER_SITE_OTHER): Payer: Medicare Other | Admitting: Interventional Cardiology

## 2018-09-30 ENCOUNTER — Encounter: Payer: Self-pay | Admitting: Interventional Cardiology

## 2018-09-30 VITALS — Ht 69.5 in | Wt 170.0 lb

## 2018-09-30 DIAGNOSIS — Z951 Presence of aortocoronary bypass graft: Secondary | ICD-10-CM | POA: Diagnosis not present

## 2018-09-30 DIAGNOSIS — I359 Nonrheumatic aortic valve disorder, unspecified: Secondary | ICD-10-CM | POA: Diagnosis not present

## 2018-09-30 DIAGNOSIS — E782 Mixed hyperlipidemia: Secondary | ICD-10-CM | POA: Diagnosis not present

## 2018-09-30 DIAGNOSIS — I480 Paroxysmal atrial fibrillation: Secondary | ICD-10-CM | POA: Diagnosis not present

## 2018-09-30 NOTE — Progress Notes (Signed)
Virtual Visit via Video Note   This visit type was conducted due to national recommendations for restrictions regarding the COVID-19 Pandemic (e.g. social distancing) in an effort to limit this patient's exposure and mitigate transmission in our community.  Due to his co-morbid illnesses, this patient is at least at moderate risk for complications without adequate follow up.  This format is felt to be most appropriate for this patient at this time.  All issues noted in this document were discussed and addressed.  A limited physical exam was performed with this format.  Please refer to the patient's chart for his consent to telehealth for [facility].   Date:  09/30/2018   ID:  Felicity Pellegrini, DOB [birth year], MRN [id number]  Patient Location: Home Provider Location: Home  PCP:  [name], MD  Cardiologist:  No primary care provider on file. Ayad Nieman Electrophysiologist:  None   Evaluation Performed:  Follow-Up Visit  Chief Complaint:  Aortic stenosis  History of Present Illness:    Joseph Morse is a 68 y.o. male with who presents via audio/video conferencing for a telehealth visit today.   In 07/2018, he had:  "Coronary artery bypass grafting x 2  Sequential SVG to OM 1 and OM2 Aortic valve replacement using a 25 mm Edwards INSPIRIS RESILIA pericardial valve."  He had PAF post op.  He had sx with that.  He could feel an irregularity, but not too fast.  At the last visit, we discussed the following: PAF:  Decrease Amio to 200 mg daily.  COntinue Coumadin for now.  At f/u in 6 weeks, may be able to stop amiodarone.   Hope to be able to stop Coumadin afte rthe three months if he is not having any more AFib.   He continues walk.  No sx like he had before his AS.    Denies : Chest pain. Dizziness. Leg edema. Nitroglycerin use. Orthopnea. Palpitations. Paroxysmal nocturnal dyspnea. Shortness of breath. Syncope.   The patient does not have symptoms concerning for  COVID-19 infection (fever, chills, cough, or new shortness of breath).    Past Medical History:  Diagnosis Date  . Chest pain   . Coronary artery disease   . Dyspnea   . Headache    migraines  . Hernia, abdominal    x 2  . History of kidney stones   . HTN (hypertension)   . Hyperlipidemia, mixed   . Post-nasal drip   . S/P right coronary artery (RCA) stent placement   . Severe aortic stenosis    Past Surgical History:  Procedure Laterality Date  . AORTIC VALVE REPLACEMENT N/A 07/30/2018   Procedure: AORTIC VALVE REPLACEMENT (AVR);  Surgeon: Alleen Borne, MD;  Location: [facility] OR;  Service: Open Heart Surgery;  Laterality: N/A;  . CORONARY ARTERY BYPASS GRAFT N/A 07/30/2018   Procedure: CORONARY ARTERY BYPASS GRAFTING (CABG)x 2  , using left internal mammary artery and right leg greater saphenous  vein harvested endoscopically;  Surgeon: Alleen Borne, MD;  Location: [facility] OR;  Service: Open Heart Surgery;  Laterality: N/A;  . HERNIA REPAIR     B/L inguinal hernia  . RIGHT HEART CATH AND CORONARY ANGIOGRAPHY N/A 07/13/2018   Procedure: RIGHT HEART CATH AND CORONARY ANGIOGRAPHY;  Surgeon: Corky Crafts, MD;  Location: [facility] INVASIVE CV LAB;  Service: Cardiovascular;  Laterality: N/A;  . TEE WITHOUT CARDIOVERSION N/A 07/30/2018   Procedure: TRANSESOPHAGEAL ECHOCARDIOGRAM (TEE);  Surgeon: Alleen Borne, MD;  Location: [facility] OR;  Service: Open Heart Surgery;  Laterality: N/A;  . TONSILLECTOMY       Current Meds  Medication Sig  . amiodarone (PACERONE) 200 MG tablet Take 1 tablet (200 mg total) by mouth daily.  Marland Kitchen. amoxicillin (AMOXIL) 500 MG capsule Take 4 capsules 1 hour prior to dental procedures  . aspirin EC 81 MG tablet Take 81 mg by mouth daily.  Marland Kitchen. atorvastatin (LIPITOR) 40 MG tablet TAKE 1 TABLET BY MOUTH DAILY  . metoprolol tartrate (LOPRESSOR) 25 MG tablet Take 1 tablet (25 mg total) by mouth 2 (two) times daily.  . nitroGLYCERIN (NITROSTAT) 0.4 MG SL tablet Place 1 tablet (0.4  mg total) under the tongue every 5 (five) minutes as needed.  Marland Kitchen. oxyCODONE-acetaminophen (PERCOCET) 5-325 MG tablet Take 1 tablet by mouth every 6 (six) hours as needed for severe pain.  Marland Kitchen. oxymetazoline (AFRIN) 0.05 % nasal spray Place 3 sprays into both nostrils 2 (two) times daily.  Marland Kitchen. warfarin (COUMADIN) 2.5 MG tablet Take 1 tablet (2.5 mg total) by mouth daily. Take one tablet by mouth daily or as directed by the Coumadin Clinic.     Allergies:   Patient has no known allergies.   Social History   Tobacco Use  . Smoking status: Former Smoker    Types: Cigarettes  . Smokeless tobacco: Never Used  . Tobacco comment:  " Quit smoking cigarettes in my 30's "  Substance Use Topics  . Alcohol use: No    Frequency: Never  . Drug use: No     Family Hx: The patient's family history includes CVA in his mother; Cancer in his maternal grandfather; Heart attack in his father; Hyperlipidemia in his sister.  ROS:   Please see the history of present illness.    Incision pain is decreasing All other systems reviewed and are negative.   Prior CV studies:   The following studies were reviewed today:    Labs/Other Tests and Data Reviewed:    EKG:  No ECG reviewed.  Recent Labs: 07/28/2018: ALT 23 07/31/2018: Magnesium 2.4 08/04/2018: BUN 19; Creatinine, Ser 1.09; Hemoglobin 9.0; Platelets 205; Potassium 4.1; Sodium 137   Recent Lipid Panel Lab Results  Component Value Date/Time   CHOL 103 04/24/2016 09:11 AM   TRIG 46 04/24/2016 09:11 AM   HDL 41 04/24/2016 09:11 AM   CHOLHDL 2.5 04/24/2016 09:11 AM   LDLCALC 53 04/24/2016 09:11 AM    Wt Readings from Last 3 Encounters:  09/30/18 170 lb (77.1 kg)  08/25/18 167 lb (75.8 kg)  08/05/18 176 lb (79.8 kg)     Objective:    Vital Signs:  Ht 5' 9.5" (1.765 m)   Wt 170 lb (77.1 kg)   BMI 24.74 kg/m    VITAL SIGNS:  reviewed GEN:  no acute distress RESPIRATORY:  normal respiratory effort, symmetric expansion PSYCH:  normal affect  exam limited by format; chest wound healing well  ASSESSMENT & PLAN:    1. CAD: No angina.  Continue aggressive secondary prevention.  2. PAF: Stop amiodarone.  Continue Coumadin until mid June.  He has an apple watch and he has not had any evidence of atrial fibrillation.  He has not had palpitations.  He will continue aspirin after he stops his Coumadin. 3. He will need SBE prophylaxis for his bioprosthetic aortic valve. 4. We will need to check lipids when concerns for the virus decrease.  COVID-19 Education: The signs and symptoms of COVID-19 were discussed with the patient and how to seek  care for testing (follow up with PCP or arrange E-visit).  The importance of social distancing was discussed today.  Time:   Today, I have spent 25 minutes with the patient with telehealth technology discussing the above problems.     Medication Adjustments/Labs and Tests Ordered: Current medicines are reviewed at length with the patient today.  Concerns regarding medicines are outlined above.   Tests Ordered: No orders of the defined types were placed in this encounter.   Medication Changes: No orders of the defined types were placed in this encounter.   Disposition:  Follow up in 6 week(s)  Signed, Lance Muss, MD  09/30/2018 9:38 AM    [facility]

## 2018-09-30 NOTE — Patient Instructions (Signed)
Medication Instructions:  Your physician has recommended you make the following change in your medication:   STOP: amiodarone   Lab work: Your physician recommends that you return for a FASTING lipid profile and complete metabolic panel on 12/29/18   If you have labs (blood work) drawn today and your tests are completely normal, you will receive your results only by: Marland Kitchen MyChart Message (if you have MyChart) OR . A paper copy in the mail If you have any lab test that is abnormal or we need to change your treatment, we will call you to review the results.  Testing/Procedures: None ordered  Follow-Up: . Follow up with Dr. Eldridge Dace via VIDEO Visit on 11/01/18 at 2:00 PM.   Any Other Special Instructions Will Be Listed Below (If Applicable).

## 2018-10-14 ENCOUNTER — Telehealth: Payer: Self-pay | Admitting: Interventional Cardiology

## 2018-10-14 NOTE — Telephone Encounter (Signed)
Called and spoke to patient's wife (DPR on file). She states that the patient has been taking plavix and just noticed on the discharge summary from the hospital in March that he was instructed to stop his plavix. The patient has been taking ASA 81 mg, plavix, and coumadin. Denies having any S/Sx of bleeding. Please advise.

## 2018-10-14 NOTE — Telephone Encounter (Signed)
New Message:    Pt have been taking Plavix and was not aware he was not supposed to be taking it. He wants to know if this might have caused any harm.Marland Kitchen

## 2018-10-15 ENCOUNTER — Telehealth: Payer: Self-pay

## 2018-10-15 MED ORDER — CLOPIDOGREL BISULFATE 75 MG PO TABS: 75.0000 mg | ORAL_TABLET | Freq: Every day | ORAL | 3 refills | Status: DC

## 2018-10-15 NOTE — Telephone Encounter (Signed)

## 2018-10-15 NOTE — Telephone Encounter (Signed)
Stop aspirin.

## 2018-10-15 NOTE — Telephone Encounter (Signed)
Called and instructed patient to stop ASA after 10/30/18 (90 days post CABG).

## 2018-10-19 ENCOUNTER — Ambulatory Visit (INDEPENDENT_AMBULATORY_CARE_PROVIDER_SITE_OTHER): Payer: Medicare Other | Admitting: Pharmacist

## 2018-10-19 ENCOUNTER — Other Ambulatory Visit: Payer: Self-pay

## 2018-10-19 DIAGNOSIS — I4891 Unspecified atrial fibrillation: Secondary | ICD-10-CM

## 2018-10-19 DIAGNOSIS — Z7901 Long term (current) use of anticoagulants: Secondary | ICD-10-CM | POA: Diagnosis not present

## 2018-10-19 DIAGNOSIS — Z951 Presence of aortocoronary bypass graft: Secondary | ICD-10-CM

## 2018-10-19 DIAGNOSIS — I480 Paroxysmal atrial fibrillation: Secondary | ICD-10-CM | POA: Diagnosis not present

## 2018-10-19 LAB — POCT INR: INR: 2 (ref 2.0–3.0)

## 2018-10-24 ENCOUNTER — Other Ambulatory Visit: Payer: Self-pay | Admitting: Physician Assistant

## 2018-10-26 ENCOUNTER — Telehealth: Payer: Self-pay | Admitting: Interventional Cardiology

## 2018-10-26 NOTE — Telephone Encounter (Signed)
Called and spoke to patient's wife. She states that for the past few 2 weeks the patient has been seeing things on the ceiling at not. She states that he knows that they are not there but he sees them. She was not sure if it could be related to his metoprolol. Patient has been taking metoprolol 25 mg BID since he was d/c'd from the hospital on 08/05/18. She states BP and HR have been okay but does not have any readings. She states that he has not been sleeping well. Denies any additional concerns. Patient has an appointment with Dr. Eldridge Dace on 11/01/18 and wishes to discuss this at the appointment.

## 2018-10-26 NOTE — Telephone Encounter (Signed)
  Pt c/o medication issue:  1. Name of Medication: metoprolol tartrate (LOPRESSOR) 25 MG tablet  2. How are you currently taking this medication (dosage and times per day)? Take 1 tablet (25 mg total) by mouth 2 (two) times daily.  3. Are you having a reaction (difficulty breathing--STAT)? no  4. What is your medication issue? Wife is calling because patient has noticed that if he takes the night time dose when he wakes up at night he is seeing hallucinations. He would like to know if this is normal for this medication. He has no issues with his daytime dosage.

## 2018-10-27 MED ORDER — ATENOLOL 25 MG PO TABS: 25.0000 mg | ORAL_TABLET | Freq: Every day | ORAL | 3 refills | Status: DC

## 2018-10-27 NOTE — Telephone Encounter (Signed)
Lets check with PharmD re hallucination side effect.

## 2018-10-27 NOTE — Telephone Encounter (Signed)
Called and instructed for patient to stop metoprolol and start atenolol 25 mg QD. Patient will monitor BP and HR and let us know if his Sx change or worsen.

## 2018-10-27 NOTE — Telephone Encounter (Signed)
OK to try atenolol 25 mg daily instead of metoprolol

## 2018-10-27 NOTE — Telephone Encounter (Signed)
Yes metoprolol can cause visual disturbances. May be worth changing to atenolol which has a lower reported incidence of visual disturbances < 1% - similar dose would be atenolol 25mg  once daily.

## 2018-11-01 ENCOUNTER — Telehealth (INDEPENDENT_AMBULATORY_CARE_PROVIDER_SITE_OTHER): Payer: Medicare Other | Admitting: Interventional Cardiology

## 2018-11-01 ENCOUNTER — Other Ambulatory Visit: Payer: Self-pay

## 2018-11-01 ENCOUNTER — Encounter: Payer: Self-pay | Admitting: Interventional Cardiology

## 2018-11-01 VITALS — BP 172/75 | HR 50 | Ht 69.5 in | Wt 170.0 lb

## 2018-11-01 DIAGNOSIS — I2510 Atherosclerotic heart disease of native coronary artery without angina pectoris: Secondary | ICD-10-CM | POA: Diagnosis not present

## 2018-11-01 DIAGNOSIS — I359 Nonrheumatic aortic valve disorder, unspecified: Secondary | ICD-10-CM

## 2018-11-01 DIAGNOSIS — E782 Mixed hyperlipidemia: Secondary | ICD-10-CM

## 2018-11-01 DIAGNOSIS — Z951 Presence of aortocoronary bypass graft: Secondary | ICD-10-CM

## 2018-11-01 DIAGNOSIS — I10 Essential (primary) hypertension: Secondary | ICD-10-CM | POA: Diagnosis not present

## 2018-11-01 DIAGNOSIS — Z7901 Long term (current) use of anticoagulants: Secondary | ICD-10-CM

## 2018-11-01 DIAGNOSIS — I480 Paroxysmal atrial fibrillation: Secondary | ICD-10-CM | POA: Diagnosis not present

## 2018-11-01 MED ORDER — LISINOPRIL 10 MG PO TABS: 10.0000 mg | ORAL_TABLET | Freq: Every day | ORAL | 11 refills | Status: DC

## 2018-11-01 NOTE — Progress Notes (Signed)
Virtual Visit via Video Note   This visit type was conducted due to national recommendations for restrictions regarding the COVID-19 Pandemic (e.g. social distancing) in an effort to limit this patient's exposure and mitigate transmission in our community.  Due to his co-morbid illnesses, this patient is at least at moderate risk for complications without adequate follow up.  This format is felt to be most appropriate for this patient at this time.  All issues noted in this document were discussed and addressed.  A limited physical exam was performed with this format.  Please refer to the patient's chart for his consent to telehealth for [facility].   Date:  11/01/2018   ID:  Joseph Morse, DOB 1951-05-06, MRN [id number]  Patient Location: Home Provider Location: Office  PCP:  Johny BlamerHarris, William, MD  Cardiologist:  No primary care provider on file. Omesha Bowerman Electrophysiologist:  None   Evaluation Performed:  Follow-Up Visit  Chief Complaint:  S/p AVR  History of Present Illness:    Joseph PellegriniDaniel S Morse is a 68 y.o. male with CAD.  PCI of RCA in 2011.   In 07/2018, he had:  "Coronary artery bypass grafting x 2  Sequential SVG to OM 1 and OM2 Aortic valve replacement using a 25 mm Edwards INSPIRIS RESILIA pericardial valve."  He had PAF post op. He had sx with that. He could feel an irregularity, but not too fast.  Plan at the visit in 09/2018: 1. Stop amiodarone.  Continue Coumadin until mid June.  He has an apple watch and he has not had any evidence of atrial fibrillation.  He has not had palpitations.  He will continue aspirin after he stops his Coumadin. 2. He will need SBE prophylaxis for his bioprosthetic aortic valve. 3. We will need to check lipids when concerns for the virus decrease.  No further AFib sx.  He had some hallucinations at night while on metoprolol.  He switched to atenolol and the hallucinations have reduced.   Denies : Chest pain. Dizziness. Leg  edema. Nitroglycerin use. Orthopnea. Palpitations. Paroxysmal nocturnal dyspnea. Shortness of breath. Syncope.   The patient does not have symptoms concerning for COVID-19 infection (fever, chills, cough, or new shortness of breath).    Past Medical History:  Diagnosis Date  . Chest pain   . Coronary artery disease   . Dyspnea   . Headache    migraines  . Hernia, abdominal    x 2  . History of kidney stones   . HTN (hypertension)   . Hyperlipidemia, mixed   . Post-nasal drip   . S/P right coronary artery (RCA) stent placement   . Severe aortic stenosis    Past Surgical History:  Procedure Laterality Date  . AORTIC VALVE REPLACEMENT N/A 07/30/2018   Procedure: AORTIC VALVE REPLACEMENT (AVR);  Surgeon: Alleen BorneBartle, Bryan K, MD;  [facility];  Service: Open Heart Surgery;  Laterality: N/A;  . CORONARY ARTERY BYPASS GRAFT N/A 07/30/2018   Procedure: CORONARY ARTERY BYPASS GRAFTING (CABG)x 2  , using left internal mammary artery and right leg greater saphenous  vein harvested endoscopically;  Surgeon: Alleen BorneBartle, Bryan K, MD;  Location: [facility] OR;  Service: Open Heart Surgery;  Laterality: N/A;  . HERNIA REPAIR     B/L inguinal hernia  . RIGHT HEART CATH AND CORONARY ANGIOGRAPHY N/A 07/13/2018   Procedure: RIGHT HEART CATH AND CORONARY ANGIOGRAPHY;  Surgeon: Corky CraftsVaranasi, Shunsuke Granzow S, MD;  [facility];  Service: Cardiovascular;  Laterality: N/A;  .  TEE WITHOUT CARDIOVERSION N/A 07/30/2018   Procedure: TRANSESOPHAGEAL ECHOCARDIOGRAM (TEE);  Surgeon: Gaye Pollack, MD;  Location: [place];  Service: Open Heart Surgery;  Laterality: N/A;  . TONSILLECTOMY       Current Meds  Medication Sig  . amoxicillin (AMOXIL) 500 MG capsule Take 4 capsules 1 hour prior to dental procedures  . aspirin EC 81 MG tablet Take 81 mg by mouth daily.  Marland Kitchen atenolol (TENORMIN) 25 MG tablet Take 1 tablet (25 mg total) by mouth daily.  Marland Kitchen atorvastatin (LIPITOR) 40 MG tablet TAKE 1 TABLET BY MOUTH DAILY  .  clopidogrel (PLAVIX) 75 MG tablet Take 1 tablet (75 mg total) by mouth daily.  . nitroGLYCERIN (NITROSTAT) 0.4 MG SL tablet Place 1 tablet (0.4 mg total) under the tongue every 5 (five) minutes as needed.  Marland Kitchen oxyCODONE-acetaminophen (PERCOCET) 5-325 MG tablet Take 1 tablet by mouth every 6 (six) hours as needed for severe pain.  Marland Kitchen oxymetazoline (AFRIN) 0.05 % nasal spray Place 3 sprays into both nostrils 2 (two) times daily.  Marland Kitchen warfarin (COUMADIN) 2.5 MG tablet Take 1 tablet (2.5 mg total) by mouth daily. Take one tablet by mouth daily or as directed by the Coumadin Clinic.     Allergies:   Patient has no known allergies.   Social History   Tobacco Use  . Smoking status: Former Smoker    Types: Cigarettes  . Smokeless tobacco: Never Used  . Tobacco comment:  " Quit smoking cigarettes in my 72's "  Substance Use Topics  . Alcohol use: No    Frequency: Never  . Drug use: No     Family Hx: The patient's family history includes CVA in his mother; Cancer in his maternal grandfather; Heart attack in his father; Hyperlipidemia in his sister.  ROS:   Please see the history of present illness.    Getting tired of taking Coumadin All other systems reviewed and are negative.   Prior CV studies:   The following studies were reviewed today:  Hospital records.  Labs/Other Tests and Data Reviewed:    EKG:  No ECG reviewed.  Recent Labs: 07/28/2018: ALT 23 07/31/2018: Magnesium 2.4 08/04/2018: BUN 19; Creatinine, Ser 1.09; Hemoglobin 9.0; Platelets 205; Potassium 4.1; Sodium 137   Recent Lipid Panel Lab Results  Component Value Date/Time   CHOL 103 04/24/2016 09:11 AM   TRIG 46 04/24/2016 09:11 AM   HDL 41 04/24/2016 09:11 AM   CHOLHDL 2.5 04/24/2016 09:11 AM   LDLCALC 53 04/24/2016 09:11 AM    Wt Readings from Last 3 Encounters:  11/01/18 170 lb (77.1 kg)  09/30/18 170 lb (77.1 kg)  08/25/18 167 lb (75.8 kg)     Objective:    Vital Signs:  BP (!) 172/75   Pulse (!) 50   Ht  5' 9.5" (1.765 m)   Wt 170 lb (77.1 kg)   BMI 24.74 kg/m    VITAL SIGNS:  reviewed GEN:  no acute distress RESPIRATORY:  normal respiratory effort, symmetric expansion PSYCH:  normal affect exam limited by video format  ASSESSMENT & PLAN:    1. CAD:  No angina. Continue aggressive secondary prevention.  2. HTN: Over the past few weeks, BP has been high.  Lisinopril was stopped and carvedilol was stopped at the time of surgery.  Atenolol was added.  Will restart Lisinopril 10.  If BP > 140/90 after 3 days, he will go back to 20 mg daily.  BMet in one week on same day  as echo. 3. PAF: No AFib.  Stop COumadin. 4. SBE prophylaxis: Plan for echo due to valve replacement.   5. Hyperlipidemia:  Needs lipids rechecked.  COVID-19 Education: The signs and symptoms of COVID-19 were discussed with the patient and how to seek care for testing (follow up with PCP or arrange E-visit).  The importance of social distancing was discussed today.  Time:   Today, I have spent 25 minutes with the patient with telehealth technology discussing the above problems.     Medication Adjustments/Labs and Tests Ordered: Current medicines are reviewed at length with the patient today.  Concerns regarding medicines are outlined above.   Tests Ordered: No orders of the defined types were placed in this encounter.   Medication Changes: No orders of the defined types were placed in this encounter.   Disposition:  Follow up in 6 month(s)  Signed, Lance MussJayadeep Lilyan Prete, MD  11/01/2018 2:12 PM    [facility]

## 2018-11-01 NOTE — Patient Instructions (Addendum)
Medication Instructions:  Your physician has recommended you make the following change in your medication:   1. STOP: coumadin  2. START: lisinopril 10 mg once a day  After 3 days: If your blood pressure is still elevated greater than 140/90 increase lisinopril to 20 mg once a day   Lab work: Your physician recommends that you return for lab work on 11/15/18 for BMET (same day as echocardiogram)  If you have labs (blood work) drawn today and your tests are completely normal, you will receive your results only by: Marland Kitchen MyChart Message (if you have MyChart) OR . A paper copy in the mail If you have any lab test that is abnormal or we need to change your treatment, we will call you to review the results.  Testing/Procedures: Your physician has requested that you have an echocardiogram on 6/22 at 1:50 PM. Echocardiography is a painless test that uses sound waves to create images of your heart. It provides your doctor with information about the size and shape of your heart and how well your heart's chambers and valves are working. This procedure takes approximately one hour. There are no restrictions for this procedure.  Follow-Up: At [facility], you and your health needs are our priority.  As part of our continuing mission to provide you with exceptional heart care, we have created designated Provider Care Teams.  These Care Teams include your primary Cardiologist (physician) and Advanced Practice Providers (APPs -  Physician Assistants and Nurse Practitioners) who all work together to provide you with the care you need, when you need it. . You will need a follow up appointment in 6 months.  Please call our office 2 months in advance to schedule this appointment.  You may see Casandra Doffing, MD or one of the following Advanced Practice Providers on your designated Care Team:   . Lyda Jester, PA-C . Dayna Dunn, PA-C . Ermalinda Barrios, PA-C  Any Other Special Instructions Will Be Listed Below  (If Applicable).

## 2018-11-10 ENCOUNTER — Telehealth: Payer: Self-pay

## 2018-11-10 NOTE — Telephone Encounter (Signed)
lmom for prescreen  

## 2018-11-12 ENCOUNTER — Telehealth (HOSPITAL_COMMUNITY): Payer: Self-pay | Admitting: Radiology

## 2018-11-12 ENCOUNTER — Telehealth: Payer: Self-pay | Admitting: *Deleted

## 2018-11-12 NOTE — Telephone Encounter (Signed)
    COVID-19 Pre-Screening Questions:  . In the past 7 to 10 days have you had a cough,  shortness of breath, headache, congestion, fever (100 or greater) body aches, chills, sore throat, or sudden loss of taste or sense of smell? . Have you been around anyone with known Covid 19. . Have you been around anyone who is awaiting Covid 19 test results in the past 7 to 10 days? . Have you been around anyone who has been exposed to Covid 19, or has mentioned symptoms of Covid 19 within the past 7 to 10 days?  If you have any concerns/questions about symptoms patients report during screening (either on the phone or at threshold). Contact the provider seeing the patient or DOD for further guidance.  If neither are available contact a member of the leadership team.           Contacted patient via telephone call. All no to Covid 19 questions . Has a mask.KB 

## 2018-11-12 NOTE — Telephone Encounter (Signed)
Called to leave instructions for echocardiogram appointment-unable to leave message voice mailbox not set up.

## 2018-11-15 ENCOUNTER — Ambulatory Visit (HOSPITAL_COMMUNITY): Payer: Medicare Other | Attending: Cardiovascular Disease

## 2018-11-15 ENCOUNTER — Other Ambulatory Visit: Payer: Self-pay

## 2018-11-15 ENCOUNTER — Other Ambulatory Visit: Payer: Medicare Other | Admitting: *Deleted

## 2018-11-15 DIAGNOSIS — I359 Nonrheumatic aortic valve disorder, unspecified: Secondary | ICD-10-CM | POA: Diagnosis not present

## 2018-11-16 ENCOUNTER — Telehealth: Payer: Self-pay | Admitting: Interventional Cardiology

## 2018-11-16 LAB — BASIC METABOLIC PANEL
BUN/Creatinine Ratio: 14 (ref 10–24)
BUN: 17 mg/dL (ref 8–27)
CO2: 24 mmol/L (ref 20–29)
Calcium: 9.7 mg/dL (ref 8.6–10.2)
Chloride: 103 mmol/L (ref 96–106)
Creatinine, Ser: 1.23 mg/dL (ref 0.76–1.27)
GFR calc Af Amer: 69 mL/min/{1.73_m2} (ref >59)
GFR calc non Af Amer: 60 mL/min/{1.73_m2} (ref >59)
Glucose: 103 mg/dL — ABNORMAL HIGH (ref 65–99)
Potassium: 4.7 mmol/L (ref 3.5–5.2)
Sodium: 141 mmol/L (ref 134–144)

## 2018-11-16 NOTE — Telephone Encounter (Signed)
-----   Message from Jettie Booze, MD sent at 11/16/2018  9:26 AM EDT ----- BMet stable.  Await echo result.  How is BP?

## 2018-11-16 NOTE — Telephone Encounter (Signed)
The patient's wife (DPR on file) has been notified of the result and verbalized understanding. She states that his BP has been better and has been around 128/72. All questions (if any) were answered.

## 2018-11-16 NOTE — Telephone Encounter (Signed)
Patient returned call for lab results.  

## 2018-12-29 ENCOUNTER — Other Ambulatory Visit: Payer: Self-pay

## 2018-12-29 ENCOUNTER — Other Ambulatory Visit: Payer: Medicare Other | Admitting: *Deleted

## 2018-12-29 DIAGNOSIS — E782 Mixed hyperlipidemia: Secondary | ICD-10-CM

## 2018-12-29 DIAGNOSIS — I480 Paroxysmal atrial fibrillation: Secondary | ICD-10-CM

## 2018-12-29 LAB — COMPREHENSIVE METABOLIC PANEL
ALT: 20 IU/L (ref 0–44)
AST: 22 IU/L (ref 0–40)
Albumin/Globulin Ratio: 2.1 (ref 1.2–2.2)
Albumin: 4.7 g/dL (ref 3.8–4.8)
Alkaline Phosphatase: 69 IU/L (ref 39–117)
BUN/Creatinine Ratio: 12 (ref 10–24)
BUN: 15 mg/dL (ref 8–27)
Bilirubin Total: 0.4 mg/dL (ref 0.0–1.2)
CO2: 25 mmol/L (ref 20–29)
Calcium: 9.7 mg/dL (ref 8.6–10.2)
Chloride: 102 mmol/L (ref 96–106)
Creatinine, Ser: 1.25 mg/dL (ref 0.76–1.27)
GFR calc Af Amer: 68 mL/min/{1.73_m2} (ref >59)
GFR calc non Af Amer: 59 mL/min/{1.73_m2} — ABNORMAL LOW (ref >59)
Globulin, Total: 2.2 g/dL (ref 1.5–4.5)
Glucose: 94 mg/dL (ref 65–99)
Potassium: 4.5 mmol/L (ref 3.5–5.2)
Sodium: 142 mmol/L (ref 134–144)
Total Protein: 6.9 g/dL (ref 6.0–8.5)

## 2018-12-29 LAB — LIPID PANEL
Chol/HDL Ratio: 2.9 ratio (ref 0.0–5.0)
Cholesterol, Total: 126 mg/dL (ref 100–199)
HDL: 43 mg/dL (ref >39)
LDL Calculated: 70 mg/dL (ref 0–99)
Triglycerides: 65 mg/dL (ref 0–149)
VLDL Cholesterol Cal: 13 mg/dL (ref 5–40)

## 2019-06-08 NOTE — Progress Notes (Signed)
Cardiology Office Note   Date:  06/09/2019   ID:  Joseph Morse, DOB 1951-01-25, MRN [id number]  PCP:  Joseph Blamer, MD    No chief complaint on file.  PAF  Wt Readings from Last 3 Encounters:  06/09/19 172 lb 12.8 oz (78.4 kg)  11/01/18 170 lb (77.1 kg)  09/30/18 170 lb (77.1 kg)       History of Present Illness: Joseph Morse is a 69 y.o. male  with CAD.  PCI of RCA in 2011.   In 07/2018, he had:  "Coronary artery bypass grafting x 2  Sequential SVG to OM 1 and OM2 Aortic valve replacement using a 25 mm Edwards INSPIRIS RESILIA pericardial valve."  He had PAF post op. He had sx with that. He could feel an irregularity, but not too fast.  Amiodarone was stopped a few months after surgery.  Lisinopril was added back for HTN.   Denies : Chest pain. Dizziness. Leg edema. Nitroglycerin use. Orthopnea. Palpitations. Paroxysmal nocturnal dyspnea. Shortness of breath. Syncope.    Plays golf and walks 18 holes.  BP at home has been well controlled.  Eating healthy. No bleeding problems.      Past Medical History:  Diagnosis Date  . Chest pain   . Coronary artery disease   . Dyspnea   . Headache    migraines  . Hernia, abdominal    x 2  . History of kidney stones   . HTN (hypertension)   . Hyperlipidemia, mixed   . Post-nasal drip   . S/P right coronary artery (RCA) stent placement   . Severe aortic stenosis     Past Surgical History:  Procedure Laterality Date  . AORTIC VALVE REPLACEMENT N/A 07/30/2018   Procedure: AORTIC VALVE REPLACEMENT (AVR);  Surgeon: Alleen Borne, MD;  Location: [facility] OR;  Service: Open Heart Surgery;  Laterality: N/A;  . CORONARY ARTERY BYPASS GRAFT N/A 07/30/2018   Procedure: CORONARY ARTERY BYPASS GRAFTING (CABG)x 2  , using left internal mammary artery and right leg greater saphenous  vein harvested endoscopically;  Surgeon: Alleen Borne, MD;  Location: [facility] OR;  Service: Open Heart Surgery;  Laterality: N/A;  .  HERNIA REPAIR     B/L inguinal hernia  . RIGHT HEART CATH AND CORONARY ANGIOGRAPHY N/A 07/13/2018   Procedure: RIGHT HEART CATH AND CORONARY ANGIOGRAPHY;  Surgeon: Corky Crafts, MD;  Location: [facility] INVASIVE CV LAB;  Service: Cardiovascular;  Laterality: N/A;  . TEE WITHOUT CARDIOVERSION N/A 07/30/2018   Procedure: TRANSESOPHAGEAL ECHOCARDIOGRAM (TEE);  Surgeon: Alleen Borne, MD;  Location: [facility] OR;  Service: Open Heart Surgery;  Laterality: N/A;  . TONSILLECTOMY       Current Outpatient Medications  Medication Sig Dispense Refill  . amoxicillin (AMOXIL) 500 MG capsule Take 4 capsules 1 hour prior to dental procedures 4 capsule 3  . aspirin EC 81 MG tablet Take 81 mg by mouth daily.    Marland Kitchen atenolol (TENORMIN) 25 MG tablet Take 1 tablet (25 mg total) by mouth daily. 90 tablet 3  . atorvastatin (LIPITOR) 40 MG tablet TAKE 1 TABLET BY MOUTH DAILY 90 tablet 3  . clopidogrel (PLAVIX) 75 MG tablet Take 1 tablet (75 mg total) by mouth daily. 90 tablet 3  . lisinopril (ZESTRIL) 20 MG tablet Take 20 mg by mouth daily.    . nitroGLYCERIN (NITROSTAT) 0.4 MG SL tablet Place 1 tablet (0.4 mg total) under the tongue every 5 (five) minutes as needed. 25  tablet 3  . oxyCODONE-acetaminophen (PERCOCET) 5-325 MG tablet Take 1 tablet by mouth every 6 (six) hours as needed for severe pain. 20 tablet 0  . oxymetazoline (AFRIN) 0.05 % nasal spray Place 3 sprays into both nostrils 2 (two) times daily.     No current facility-administered medications for this visit.    Allergies:   Patient has no known allergies.    Social History:  The patient  reports that he has quit smoking. His smoking use included cigarettes. He has never used smokeless tobacco. He reports that he does not drink alcohol or use drugs.   Family History:  The patient's family history includes CVA in his mother; Cancer in his maternal grandfather; Heart attack in his father; Hyperlipidemia in his sister.    ROS:  Please see the history of  present illness.   Otherwise, review of systems are positive for fatigue.   All other systems are reviewed and negative.    PHYSICAL EXAM: VS:  BP 136/76   Pulse (!) 58   Ht 5' 9.5" (1.765 m)   Wt 172 lb 12.8 oz (78.4 kg)   SpO2 99%   BMI 25.15 kg/m  , BMI Body mass index is 25.15 kg/m. GEN: Well nourished, well developed, in no acute distress  HEENT: normal  Neck: no JVD, carotid bruits, or masses Cardiac: RRR; no murmurs, rubs, or gallops,no edema  Respiratory:  clear to auscultation bilaterally, normal work of breathing GI: soft, nontender, nondistended, + BS MS: no deformity or atrophy  Skin: warm and dry, no rash Neuro:  Strength and sensation are intact Psych: euthymic mood, full affect   EKG:   The ekg ordered today demonstrates *NSR LBBB   Recent Labs: 07/31/2018: Magnesium 2.4 08/04/2018: Hemoglobin 9.0; Platelets 205 12/29/2018: ALT 20; BUN 15; Creatinine, Ser 1.25; Potassium 4.5; Sodium 142   Lipid Panel    Component Value Date/Time   CHOL 126 12/29/2018 0807   TRIG 65 12/29/2018 0807   HDL 43 12/29/2018 0807   CHOLHDL 2.9 12/29/2018 0807   CHOLHDL 2.5 04/24/2016 0911   VLDL 9 04/24/2016 0911   LDLCALC 70 12/29/2018 0807     Other studies Reviewed: Additional studies/ records that were reviewed today with results demonstrating: labs reviewed.   ASSESSMENT AND PLAN:  1.   CAD: No angina.  Continue aggressive secondary prevention.  2. HTN: The current medical regimen is effective;  continue present plan and medications. 3. PAF: No palpitations.  On atenolol.  Noted that his HR does not increase much with walking.  If fatigue increases, could consider stopping atenolol.  4. S/p AVR: SBE prophylaxis.  5. Hyperlipidemia: LDL 70.  Continue statin.    Current medicines are reviewed at length with the patient today.  The patient concerns regarding his medicines were addressed.  The following changes have been made:  No change  Labs/ tests ordered today  include:  No orders of the defined types were placed in this encounter.   Recommend 150 minutes/week of aerobic exercise Low fat, low carb, high fiber diet recommended  Disposition:   FU in 1 year   Signed, Larae Grooms, MD  06/09/2019 4:17 PM    [facility] [place], [place], [place]  [address] Phone: [phone]; Fax: [phone]

## 2019-06-09 ENCOUNTER — Ambulatory Visit: Payer: Medicare Other | Admitting: Interventional Cardiology

## 2019-06-09 ENCOUNTER — Other Ambulatory Visit: Payer: Self-pay

## 2019-06-09 ENCOUNTER — Encounter: Payer: Self-pay | Admitting: Interventional Cardiology

## 2019-06-09 VITALS — BP 136/76 | HR 58 | Ht 69.5 in | Wt 172.8 lb

## 2019-06-09 DIAGNOSIS — Z951 Presence of aortocoronary bypass graft: Secondary | ICD-10-CM | POA: Diagnosis not present

## 2019-06-09 DIAGNOSIS — I2510 Atherosclerotic heart disease of native coronary artery without angina pectoris: Secondary | ICD-10-CM

## 2019-06-09 DIAGNOSIS — I359 Nonrheumatic aortic valve disorder, unspecified: Secondary | ICD-10-CM

## 2019-06-09 DIAGNOSIS — I480 Paroxysmal atrial fibrillation: Secondary | ICD-10-CM

## 2019-06-09 DIAGNOSIS — E782 Mixed hyperlipidemia: Secondary | ICD-10-CM | POA: Diagnosis not present

## 2019-06-09 NOTE — Patient Instructions (Signed)

## 2019-07-25 ENCOUNTER — Other Ambulatory Visit: Payer: Self-pay | Admitting: Physician Assistant

## 2019-10-20 ENCOUNTER — Other Ambulatory Visit: Payer: Self-pay | Admitting: Interventional Cardiology

## 2019-11-02 ENCOUNTER — Other Ambulatory Visit: Payer: Self-pay | Admitting: Interventional Cardiology

## 2019-11-12 ENCOUNTER — Other Ambulatory Visit: Payer: Self-pay | Admitting: Physician Assistant

## 2019-11-14 NOTE — Telephone Encounter (Signed)
Refill sent in for SBE prophylaxis.  

## 2019-11-14 NOTE — Telephone Encounter (Signed)
Pt's pharmacy is requesting a refill on amoxicillin. Would Dr. Varanasi like to refill this medication? Please address 

## 2020-04-02 ENCOUNTER — Other Ambulatory Visit: Payer: Self-pay | Admitting: Interventional Cardiology

## 2020-04-23 ENCOUNTER — Other Ambulatory Visit: Payer: Self-pay | Admitting: Interventional Cardiology

## 2020-05-07 ENCOUNTER — Other Ambulatory Visit: Payer: Self-pay | Admitting: Interventional Cardiology

## 2020-05-07 ENCOUNTER — Telehealth: Payer: Self-pay | Admitting: Interventional Cardiology

## 2020-05-07 NOTE — Telephone Encounter (Signed)
    *  STAT* If patient is at the pharmacy, call can be transferred to refill team.   1. Which medications need to be refilled? (please list name of each medication and dose if known)   clopidogrel (PLAVIX) 75 MG tablet    2. Which pharmacy/location (including street and city if local pharmacy) is medication to be sent to? [id number] - , [place] - [address]  3. Do they need a 30 day or 90 day supply? 90 days   Pt have appt with Dr. Eldridge Dace on 06/14/20

## 2020-05-07 NOTE — Telephone Encounter (Signed)
Called pt to inform him that we sent in his medication clopidogrel on 04/02/20 with a 90 day supply and that he needed to call his pharmacy and speak with a live person to request this refill. I advised pt that if he has any other problems, questions or concerns, to give our office a call back. Pt verbalized understanding.

## 2020-05-24 ENCOUNTER — Telehealth: Payer: Self-pay | Admitting: Interventional Cardiology

## 2020-05-24 NOTE — Telephone Encounter (Signed)
New message:    Pt have some dental appointments coming up. He needs to take amoxicillin before he goes. He would like Dr Jim Like to call this in for him please.

## 2020-05-24 NOTE — Telephone Encounter (Signed)
Spoke with pt's wife Agustin Cree, [place] who states pt's first dental appointment scheduled for 05/29/2020 and he will need Amoxicillin refill as he is out of current refills.  Pt's wife advised will forward request and to check with pharmacy on Monday afternoon.  Pt's wife verbalizes understanding and agrees with current plan.

## 2020-05-25 NOTE — Telephone Encounter (Signed)
OK for amoxicillin 2 grams 1 hour prior to dental treatment,  JV

## 2020-05-28 ENCOUNTER — Other Ambulatory Visit: Payer: Self-pay

## 2020-05-28 MED ORDER — AMOXICILLIN 500 MG PO CAPS: ORAL_CAPSULE | ORAL | 3 refills | Status: DC

## 2020-05-28 NOTE — Progress Notes (Signed)
Amoxicillin Refill Rx sent-called and left the patients daughter a message informing her to call back with questions.

## 2020-05-28 NOTE — Telephone Encounter (Signed)
Amoxicillin Refill Rx sent-called and left the patients daughter a message informing her to call back with questions.  

## 2020-06-12 NOTE — Progress Notes (Unsigned)
Cardiology Office Note   Date:  06/14/2020   ID:  Joseph Morse, DOB July 31, 1950, MRN [id number]  PCP:  [name], MD    No chief complaint on file.  CAD  Wt Readings from Last 3 Encounters:  06/14/20 172 lb (78 kg)  06/09/19 172 lb 12.8 oz (78.4 kg)  11/01/18 170 lb (77.1 kg)       History of Present Illness: Joseph Morse is a 70 y.o. male  with CAD. PCI of RCA in 2011.   In 07/2018, he had:  "Coronary artery bypass grafting x 2  Sequential SVG to OM 1 and OM2 Aortic valve replacement using a 25 mm Edwards INSPIRIS RESILIA pericardial valve."  He had PAF post op. He had sx with that. He could feel an irregularity, but not too fast.  Amiodarone was stopped a few months after surgery.  Lisinopril was added back for HTN.   Plays golf and walks 18 holes.  BP at home has been well.    Denies : Chest pain. Dizziness. Leg edema. Nitroglycerin use. Orthopnea. Palpitations. Paroxysmal nocturnal dyspnea. Shortness of breath. Syncope.   Got COVID vaccines.  Wife and daughte rhad COVID.  He had mild sx.  Never got tested.  Wife and daughter do not want vaccine.     Past Medical History:  Diagnosis Date  . Chest pain   . Coronary artery disease   . Dyspnea   . Headache    migraines  . Hernia, abdominal    x 2  . History of kidney stones   . HTN (hypertension)   . Hyperlipidemia, mixed   . Post-nasal drip   . S/P right coronary artery (RCA) stent placement   . Severe aortic stenosis     Past Surgical History:  Procedure Laterality Date  . AORTIC VALVE REPLACEMENT N/A 07/30/2018   Procedure: AORTIC VALVE REPLACEMENT (AVR);  Surgeon: Alleen Borne, MD;  Location: [facility] OR;  Service: Open Heart Surgery;  Laterality: N/A;  . CORONARY ARTERY BYPASS GRAFT N/A 07/30/2018   Procedure: CORONARY ARTERY BYPASS GRAFTING (CABG)x 2  , using left internal mammary artery and right leg greater saphenous  vein harvested endoscopically;  Surgeon: Alleen Borne,  MD;  Location: [facility] OR;  Service: Open Heart Surgery;  Laterality: N/A;  . HERNIA REPAIR     B/L inguinal hernia  . RIGHT HEART CATH AND CORONARY ANGIOGRAPHY N/A 07/13/2018   Procedure: RIGHT HEART CATH AND CORONARY ANGIOGRAPHY;  Surgeon: Corky Crafts, MD;  Location: [facility] INVASIVE CV LAB;  Service: Cardiovascular;  Laterality: N/A;  . TEE WITHOUT CARDIOVERSION N/A 07/30/2018   Procedure: TRANSESOPHAGEAL ECHOCARDIOGRAM (TEE);  Surgeon: Alleen Borne, MD;  Location: [facility] OR;  Service: Open Heart Surgery;  Laterality: N/A;  . TONSILLECTOMY       Current Outpatient Medications  Medication Sig Dispense Refill  . amoxicillin (AMOXIL) 500 MG capsule 4 capsules 1 hour prior to dental procedure 4 capsule 3  . aspirin EC 81 MG tablet Take 81 mg by mouth daily.    Marland Kitchen atenolol (TENORMIN) 25 MG tablet Take 1 tablet (25 mg total) by mouth daily. Please keep upcoming appt with Dr. Eldridge Dace in January 2022. Thank you 90 tablet 0  . atorvastatin (LIPITOR) 40 MG tablet TAKE 1 TABLET BY MOUTH EVERY DAY 90 tablet 3  . clopidogrel (PLAVIX) 75 MG tablet Take 1 tablet (75 mg total) by mouth daily. Please keep upcoming appt in January 2022 with Dr. Eldridge Dace before  anymore refills. Thank you 90 tablet 0  . lisinopril (ZESTRIL) 20 MG tablet TAKE 1 TABLET BY MOUTH EVERY DAY 90 tablet 3  . oxyCODONE-acetaminophen (PERCOCET) 5-325 MG tablet Take 1 tablet by mouth every 6 (six) hours as needed for severe pain. 20 tablet 0  . oxymetazoline (AFRIN) 0.05 % nasal spray Place 3 sprays into both nostrils 2 (two) times daily.    . nitroGLYCERIN (NITROSTAT) 0.4 MG SL tablet Place 1 tablet (0.4 mg total) under the tongue every 5 (five) minutes as needed. 25 tablet 3   No current facility-administered medications for this visit.    Allergies:   Patient has no known allergies.    Social History:  The patient  reports that he has quit smoking. His smoking use included cigarettes. He has never used smokeless tobacco. He reports  that he does not drink alcohol and does not use drugs.   Family History:  The patient's family history includes CVA in his mother; Cancer in his maternal grandfather; Heart attack in his father; Hyperlipidemia in his sister.    ROS:  Please see the history of present illness.   Otherwise, review of systems are positive for dental issues.   All other systems are reviewed and negative.    PHYSICAL EXAM: VS:  BP 132/90   Pulse (!) 52   Ht 5' 9.5" (1.765 m)   Wt 172 lb (78 kg)   SpO2 97%   BMI 25.04 kg/m  , BMI Body mass index is 25.04 kg/m. GEN: Well nourished, well developed, in no acute distress  HEENT: normal  Neck: no JVD, carotid bruits, or masses Cardiac: bradycardia, regular; 2/6 murmurs, no rubs, or gallops,no edema  Respiratory:  clear to auscultation bilaterally, normal work of breathing GI: soft, nontender, nondistended, + BS MS: no deformity or atrophy  Skin: warm and dry, no rash Neuro:  Strength and sensation are intact Psych: euthymic mood, full affect   EKG:   The ekg ordered today demonstrates sinus bradycardia, LBBB   Recent Labs: No results found for requested labs within last 8760 hours.   Lipid Panel    Component Value Date/Time   CHOL 126 12/29/2018 0807   TRIG 65 12/29/2018 0807   HDL 43 12/29/2018 0807   CHOLHDL 2.9 12/29/2018 0807   CHOLHDL 2.5 04/24/2016 0911   VLDL 9 04/24/2016 0911   LDLCALC 70 12/29/2018 0807     Other studies Reviewed: Additional studies/ records that were reviewed today with results demonstrating: .   ASSESSMENT AND PLAN:  1.   CAD: No angina.  Continue aggressive secondary prevention. Whole fod, plant based diet. Continue regular exercise.  2. HTN: 135/60 at home in general. The current medical regimen is effective;  continue present plan and medications. 3. Hyperlipidemia: Will check lipids today. 4. PAF: In NSR, sinus brady.  Follows HR on his watch.  Watch for lightheadedness.  Decrease atenolol to 12.5 mg  daily.  Given bradycardia, check TSH.  5. S/p AVR: SBE prophylaxis.    Current medicines are reviewed at length with the patient today.  The patient concerns regarding his medicines were addressed.  The following changes have been made:  No change  Labs/ tests ordered today include:  No orders of the defined types were placed in this encounter.   Recommend 150 minutes/week of aerobic exercise Low fat, low carb, high fiber diet recommended  Disposition:   FU in 1 year   Signed, Lance Muss, MD  06/14/2020 11:24 AM  [facility] [place], [place], [place]  [address] Phone: [phone]; Fax: [phone]

## 2020-06-13 ENCOUNTER — Ambulatory Visit: Payer: Medicare Other | Admitting: Interventional Cardiology

## 2020-06-14 ENCOUNTER — Encounter: Payer: Self-pay | Admitting: Interventional Cardiology

## 2020-06-14 ENCOUNTER — Ambulatory Visit: Payer: Medicare Other | Admitting: Interventional Cardiology

## 2020-06-14 ENCOUNTER — Other Ambulatory Visit: Payer: Self-pay

## 2020-06-14 VITALS — BP 132/90 | HR 52 | Ht 69.5 in | Wt 172.0 lb

## 2020-06-14 DIAGNOSIS — E782 Mixed hyperlipidemia: Secondary | ICD-10-CM | POA: Diagnosis not present

## 2020-06-14 DIAGNOSIS — Z952 Presence of prosthetic heart valve: Secondary | ICD-10-CM

## 2020-06-14 DIAGNOSIS — Z7901 Long term (current) use of anticoagulants: Secondary | ICD-10-CM | POA: Diagnosis not present

## 2020-06-14 DIAGNOSIS — I480 Paroxysmal atrial fibrillation: Secondary | ICD-10-CM | POA: Diagnosis not present

## 2020-06-14 LAB — CBC
Hematocrit: 42.2 % (ref 37.5–51.0)
Hemoglobin: 14.3 g/dL (ref 13.0–17.7)
MCH: 30.2 pg (ref 26.6–33.0)
MCHC: 33.9 g/dL (ref 31.5–35.7)
MCV: 89 fL (ref 79–97)
Platelets: 252 10*3/uL (ref 150–450)
RBC: 4.73 x10E6/uL (ref 4.14–5.80)
RDW: 12.2 % (ref 11.6–15.4)
WBC: 7.1 10*3/uL (ref 3.4–10.8)

## 2020-06-14 LAB — LIPID PANEL
Chol/HDL Ratio: 2.7 ratio (ref 0.0–5.0)
Cholesterol, Total: 118 mg/dL (ref 100–199)
HDL: 44 mg/dL (ref >39)
LDL Chol Calc (NIH): 63 mg/dL (ref 0–99)
Triglycerides: 44 mg/dL (ref 0–149)
VLDL Cholesterol Cal: 11 mg/dL (ref 5–40)

## 2020-06-14 LAB — COMPREHENSIVE METABOLIC PANEL
ALT: 24 IU/L (ref 0–44)
AST: 23 IU/L (ref 0–40)
Albumin/Globulin Ratio: 2.4 — ABNORMAL HIGH (ref 1.2–2.2)
Albumin: 4.6 g/dL (ref 3.8–4.8)
Alkaline Phosphatase: 52 IU/L (ref 44–121)
BUN/Creatinine Ratio: 11 (ref 10–24)
BUN: 12 mg/dL (ref 8–27)
Bilirubin Total: 0.6 mg/dL (ref 0.0–1.2)
CO2: 24 mmol/L (ref 20–29)
Calcium: 9.3 mg/dL (ref 8.6–10.2)
Chloride: 104 mmol/L (ref 96–106)
Creatinine, Ser: 1.13 mg/dL (ref 0.76–1.27)
GFR calc Af Amer: 76 mL/min/{1.73_m2} (ref >59)
GFR calc non Af Amer: 66 mL/min/{1.73_m2} (ref >59)
Globulin, Total: 1.9 g/dL (ref 1.5–4.5)
Glucose: 105 mg/dL — ABNORMAL HIGH (ref 65–99)
Potassium: 4.7 mmol/L (ref 3.5–5.2)
Sodium: 141 mmol/L (ref 134–144)
Total Protein: 6.5 g/dL (ref 6.0–8.5)

## 2020-06-14 LAB — TSH: TSH: 0.936 u[IU]/mL (ref 0.450–4.500)

## 2020-06-14 NOTE — Patient Instructions (Signed)
Medication Instructions:  Your physician recommends that you continue on your current medications as directed. Please refer to the Current Medication list given to you today.  *If you need a refill on your cardiac medications before your next appointment, please call your pharmacy*   Lab Work: TODAY: CMET, CBC, Lipids, TSH  If you have labs (blood work) drawn today and your tests are completely normal, you will receive your results only by: Marland Kitchen MyChart Message (if you have MyChart) OR . A paper copy in the mail If you have any lab test that is abnormal or we need to change your treatment, we will call you to review the results.   Testing/Procedures: NONE   Follow-Up: At [facility], you and your health needs are our priority.  As part of our continuing mission to provide you with exceptional heart care, we have created designated Provider Care Teams.  These Care Teams include your primary Cardiologist (physician) and Advanced Practice Providers (APPs -  Physician Assistants and Nurse Practitioners) who all work together to provide you with the care you need, when you need it.      Your next appointment:   12 month(s)  The format for your next appointment:   In Person  Provider:   You may see Lance Muss, MD or one of the following Advanced Practice Providers on your designated Care Team:    Ronie Spies, PA-C  Jacolyn Reedy, PA-C

## 2020-07-11 ENCOUNTER — Telehealth: Payer: Self-pay | Admitting: Interventional Cardiology

## 2020-07-11 DIAGNOSIS — R002 Palpitations: Secondary | ICD-10-CM

## 2020-07-11 NOTE — Telephone Encounter (Signed)
    Pt's wife following up the mychart message sent this morning. Advised the message sent to Dr. Eldridge Dace and his nurse and will cb or response to her mychart message once they hear back from Dr. Eldridge Dace. She understood

## 2020-07-12 ENCOUNTER — Ambulatory Visit (INDEPENDENT_AMBULATORY_CARE_PROVIDER_SITE_OTHER): Payer: Medicare Other

## 2020-07-12 ENCOUNTER — Encounter: Payer: Self-pay | Admitting: *Deleted

## 2020-07-12 DIAGNOSIS — R002 Palpitations: Secondary | ICD-10-CM

## 2020-07-12 NOTE — Progress Notes (Signed)
Patient ID: HARM JOU, male   DOB: 1950/07/21, 70 y.o.   MRN: [id number] Patient enrolled for 14 day ZIO XT to be shipped to his home.

## 2020-07-16 DIAGNOSIS — R002 Palpitations: Secondary | ICD-10-CM

## 2020-07-17 ENCOUNTER — Other Ambulatory Visit: Payer: Self-pay | Admitting: Interventional Cardiology

## 2020-07-28 ENCOUNTER — Other Ambulatory Visit: Payer: Self-pay | Admitting: Interventional Cardiology

## 2020-07-29 ENCOUNTER — Other Ambulatory Visit: Payer: Self-pay | Admitting: Interventional Cardiology

## 2020-08-07 DIAGNOSIS — R002 Palpitations: Secondary | ICD-10-CM | POA: Diagnosis not present

## 2020-08-08 ENCOUNTER — Telehealth: Payer: Self-pay | Admitting: Interventional Cardiology

## 2020-08-08 NOTE — Telephone Encounter (Signed)
Patient returned call regarding monitor results. RN reviewed results and discussed further palpitations. Patient denies palpitations at this time, and states he thinks that they stopped around 07/14/2020. Patient states he is feeling well at this time. RN encouraged patient to contact the office with questions, and we would update him with any changes recommended by Dr. Eldridge Dace. Patient appreciative of phone call.  RN to route to MD.

## 2020-08-08 NOTE — Telephone Encounter (Signed)
Patient was returning phone call to Dr. Eldridge Dace or Dr. Hoyle Barr nurse

## 2020-10-26 ENCOUNTER — Other Ambulatory Visit: Payer: Self-pay | Admitting: Interventional Cardiology

## 2020-11-16 IMAGING — CR DG CHEST 2V
2 series · 2 of 2 positions shown · non-contrast
Comparison: None.

CLINICAL DATA: Preop evaluation for upcoming heart surgery

EXAM:
CHEST - 2 VIEW

[w chest pa]
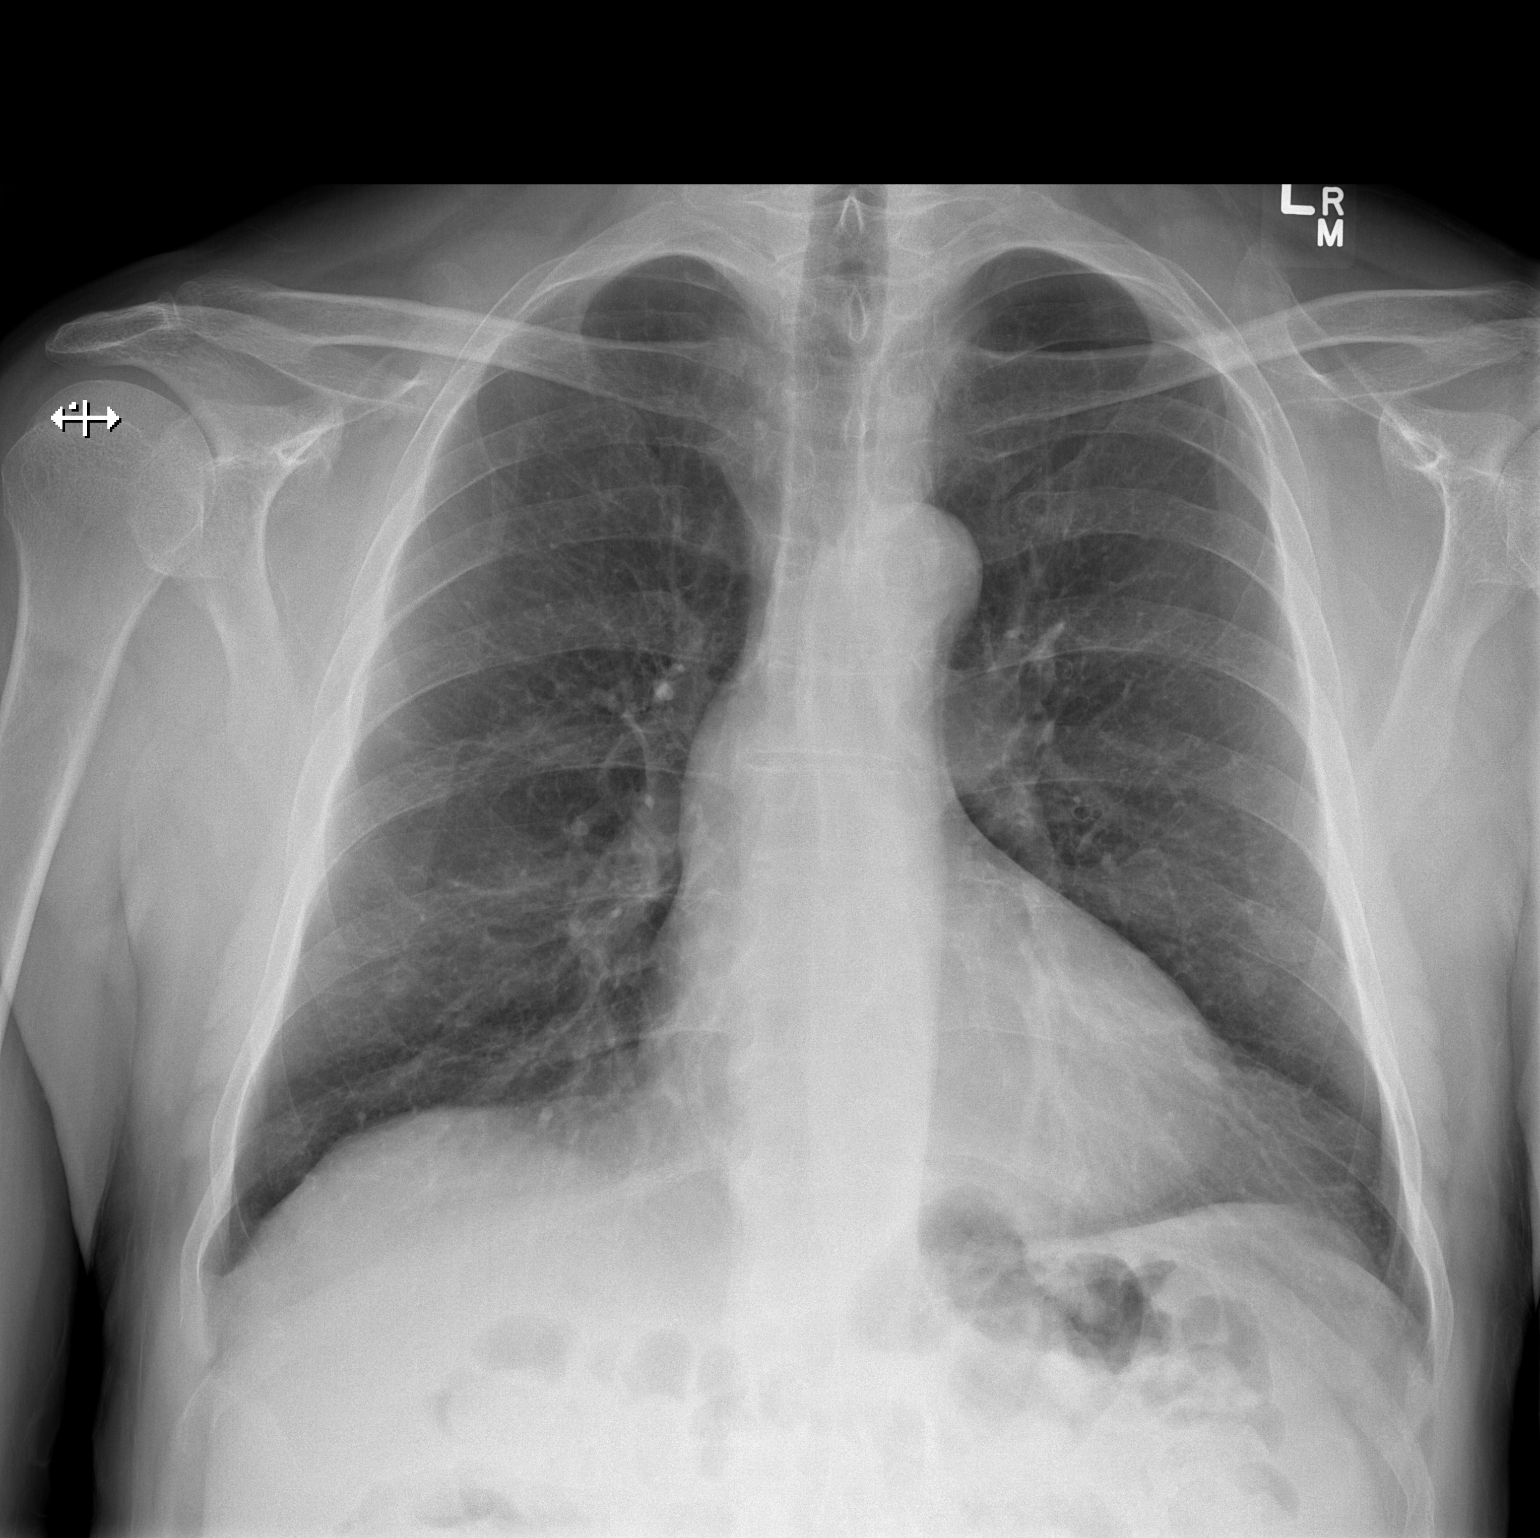

[w chest lat]
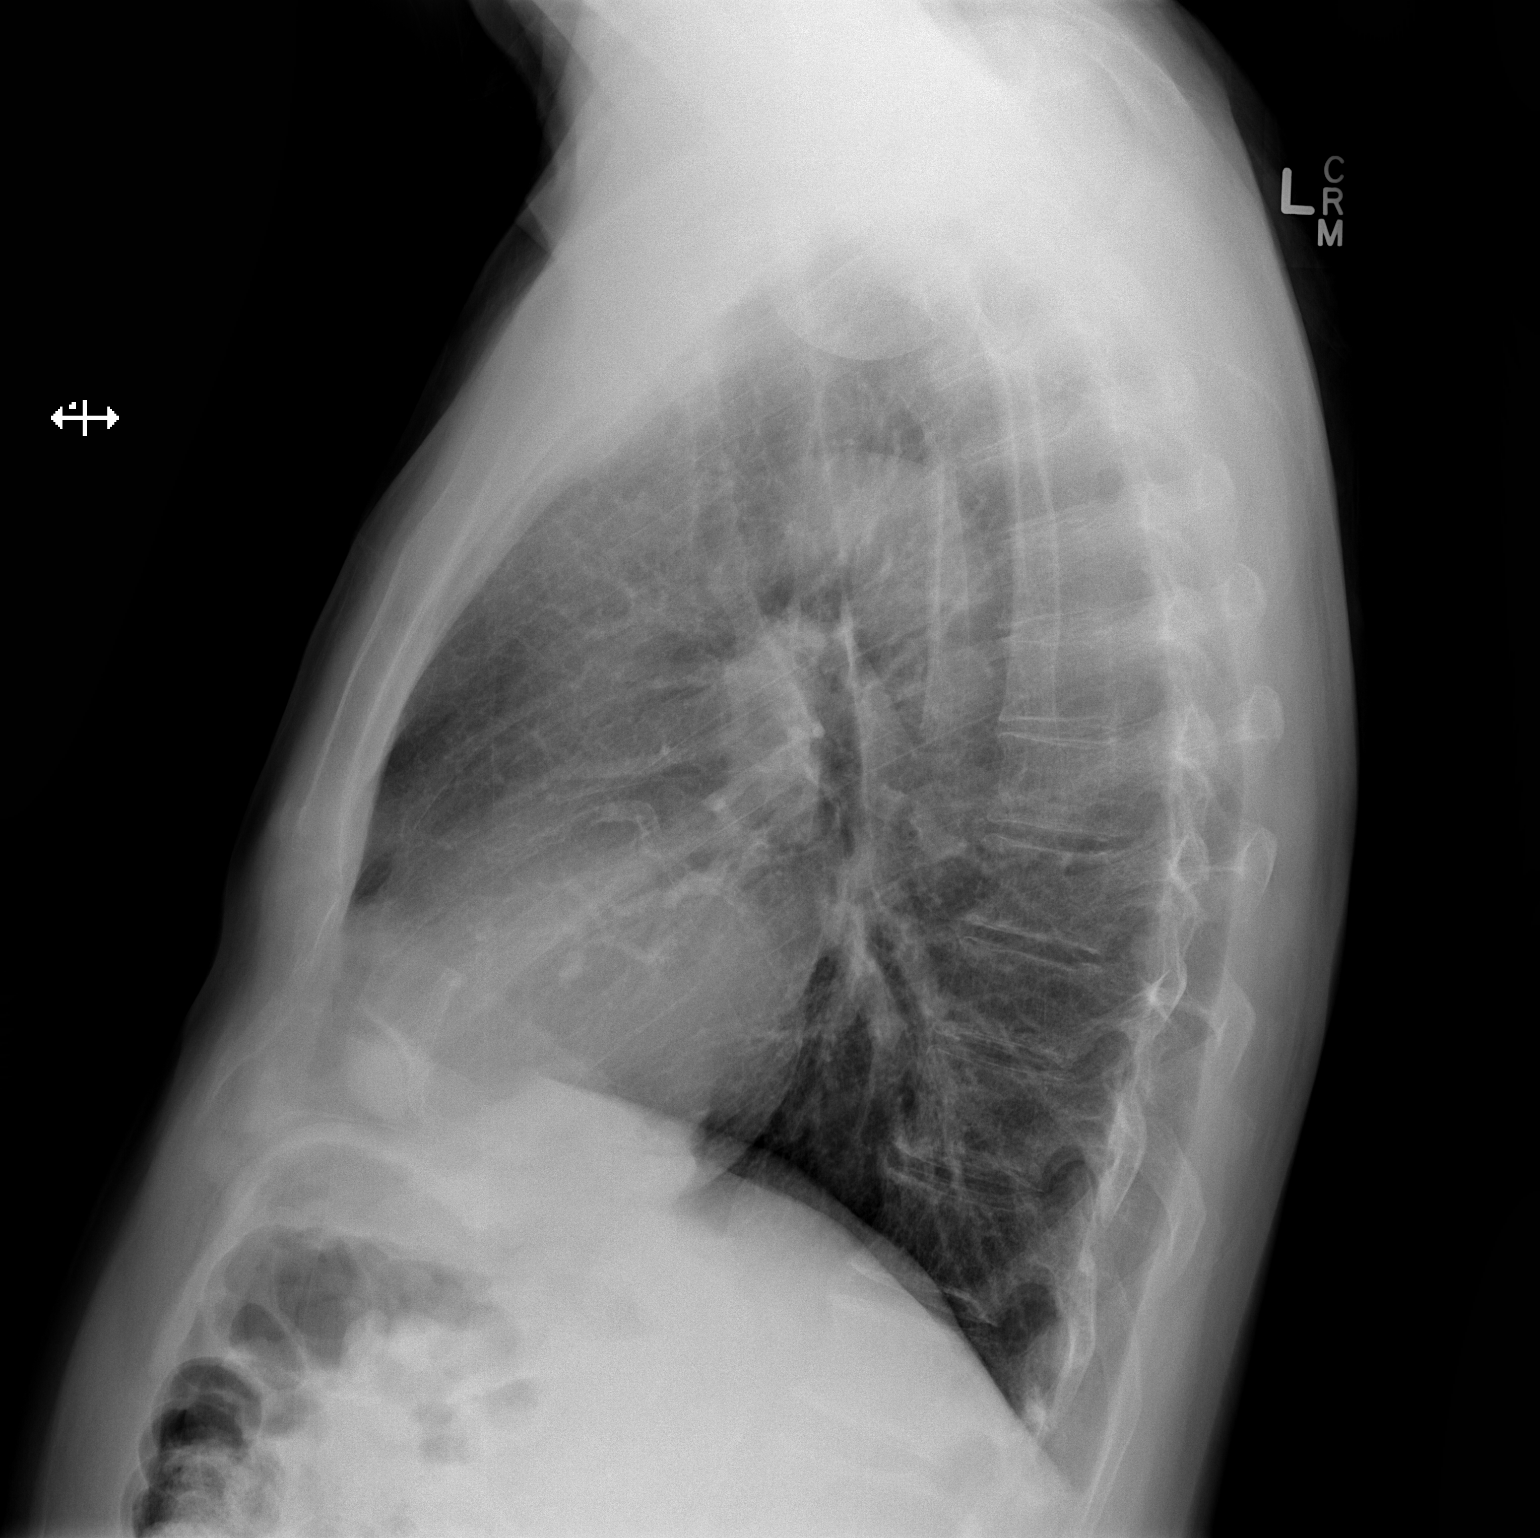

[2 of 2 positions shown; findings below may reference images not displayed]

FINDINGS: The heart size and mediastinal contours are within normal limits.
Both lungs are clear. The visualized skeletal structures are
unremarkable. Bilateral nipple shadows are noted.
IMPRESSION: No active cardiopulmonary disease.

## 2020-11-18 IMAGING — DX DG CHEST 1V PORT
1 series · 1 of 1 positions shown · non-contrast
Comparison: 07/28/2018 chest radiograph.

CLINICAL DATA: Status post CABG and aortic valve replacement

EXAM:
PORTABLE CHEST 1 VIEW

[chest]
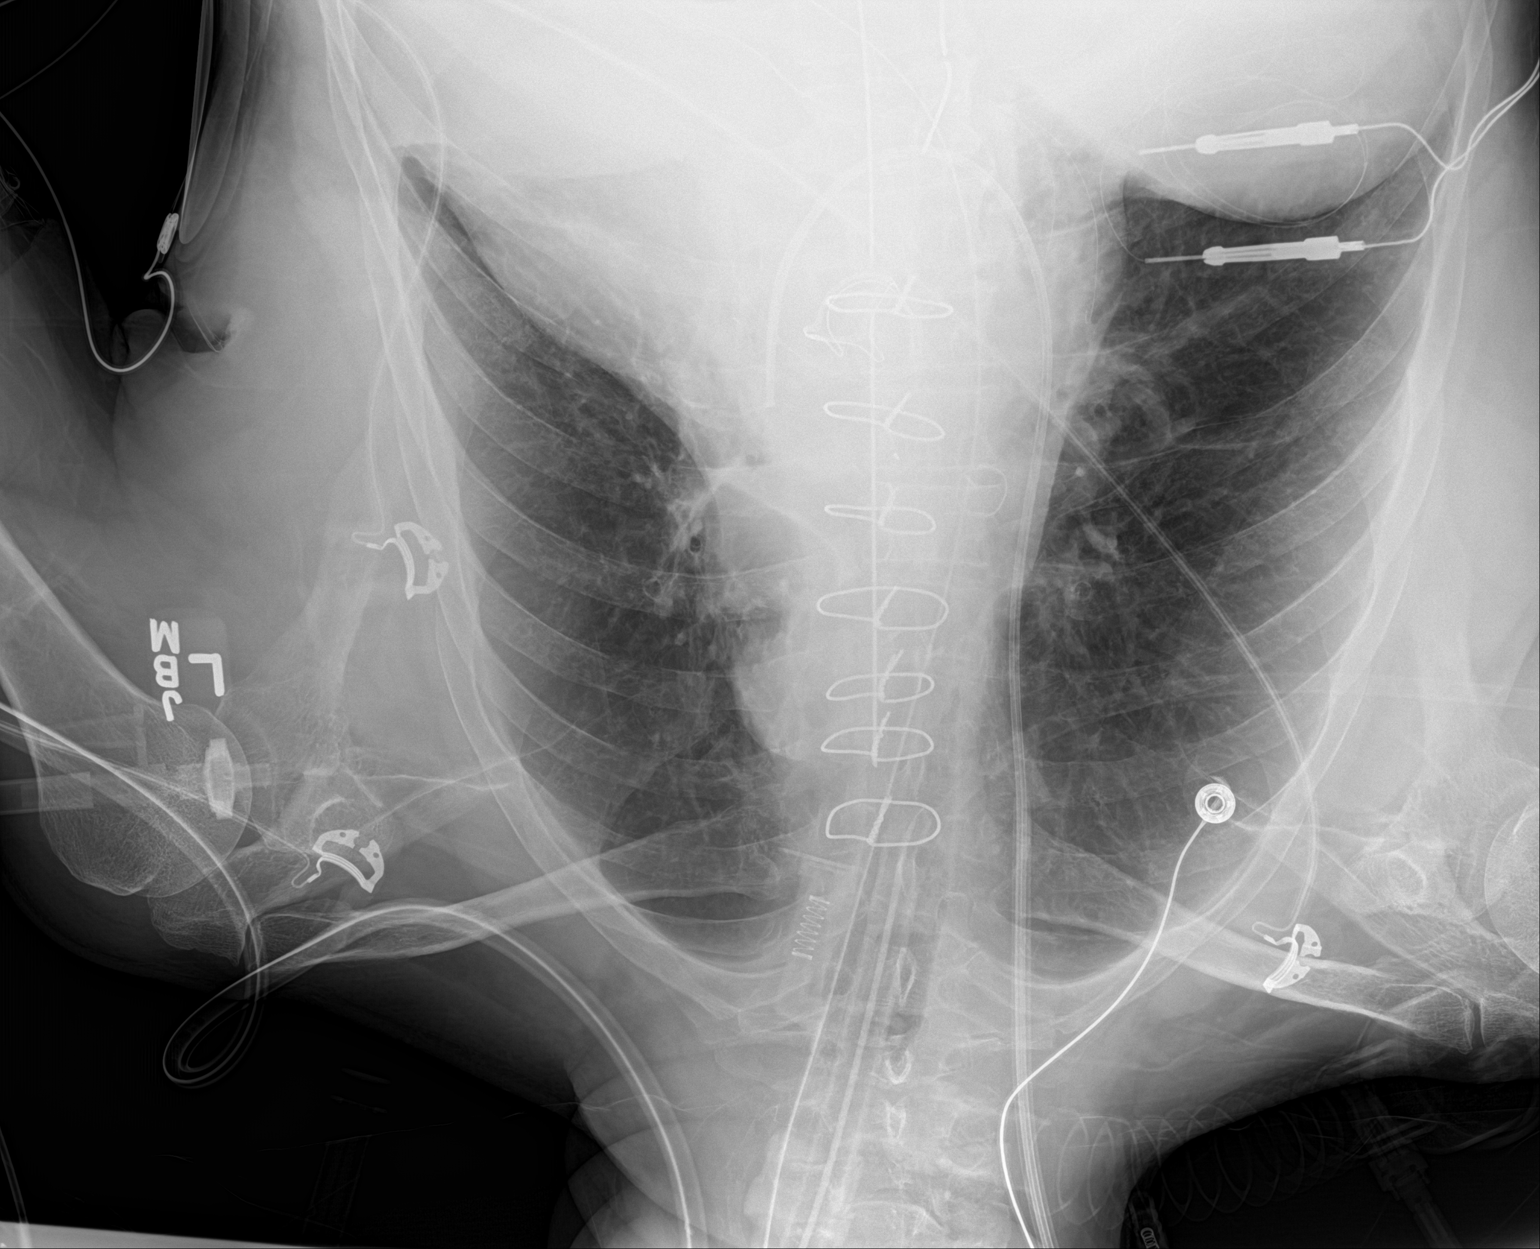

[1 of 1 positions shown; findings below may reference images not displayed]

FINDINGS: Endotracheal tube tip is 5.5 cm above the carina. Enteric tube
enters stomach with the tip not seen on this image. Intact
sternotomy wires. Aortic valve prosthesis is in place. Right
internal jugular Swan-Ganz catheter terminates over right
ventricular outflow tract. Two mediastinal drains are in place.
Stable cardiomediastinal silhouette with top-normal heart size. No
pneumothorax. No significant pleural effusion. Mild left basilar
atelectasis. No pulmonary edema.
IMPRESSION: 1. No pneumothorax.
2. Right internal jugular Swan-Ganz catheter terminates over the
right ventricular outflow tract. Additional support structures as
detailed.
3. Mild left basilar atelectasis.

## 2020-11-18 IMAGING — DX DG TIBIA/FIBULA PORT 2V*R*
1 series · 2 of 2 positions shown · non-contrast
Comparison: None.

CLINICAL DATA: Possible needle left in right lower leg. Call
reports to Dr.Hazmatullah

EXAM:
PORTABLE RIGHT TIBIA AND FIBULA - 2 VIEW

[Series 1: leg · 0.14mm/px · 2 of 2 slices shown]
[im 1/2]
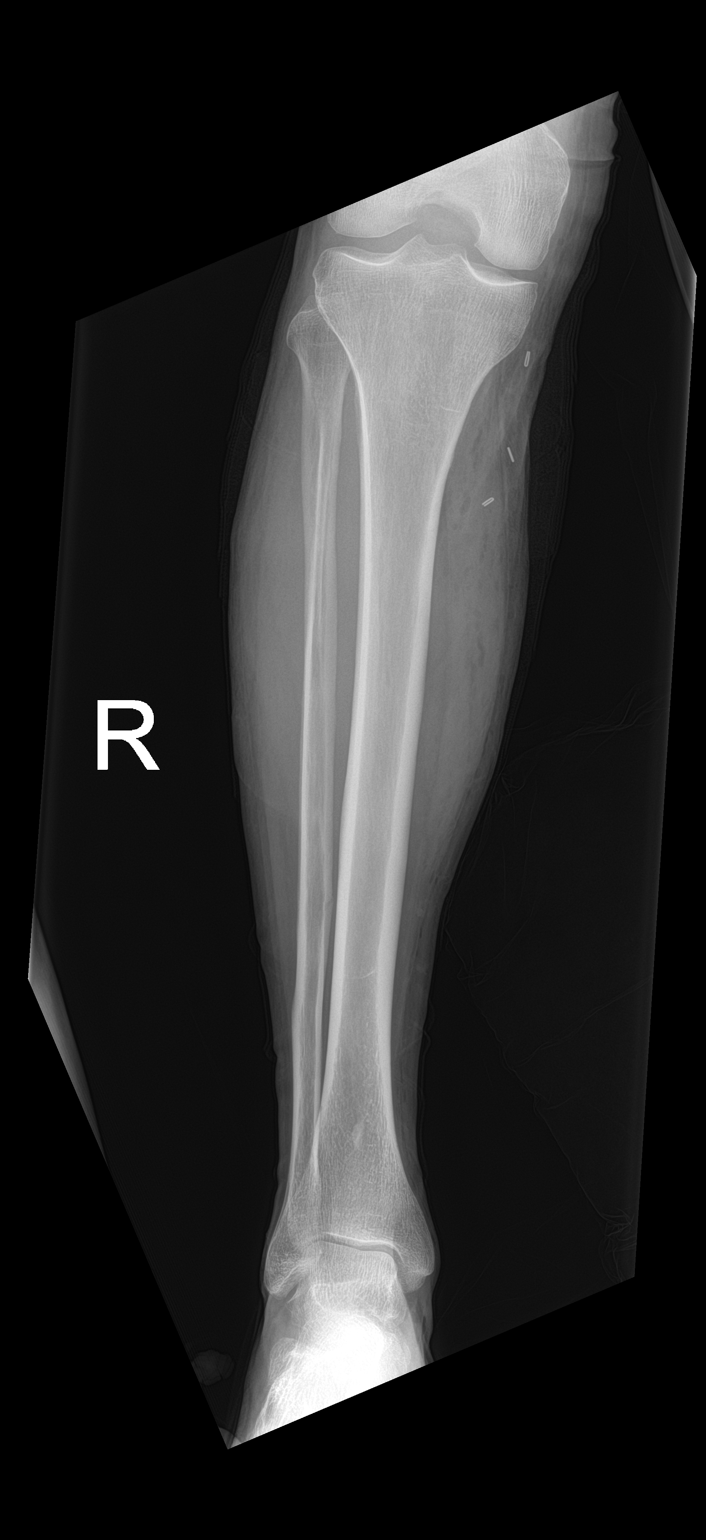
[im 2/2]
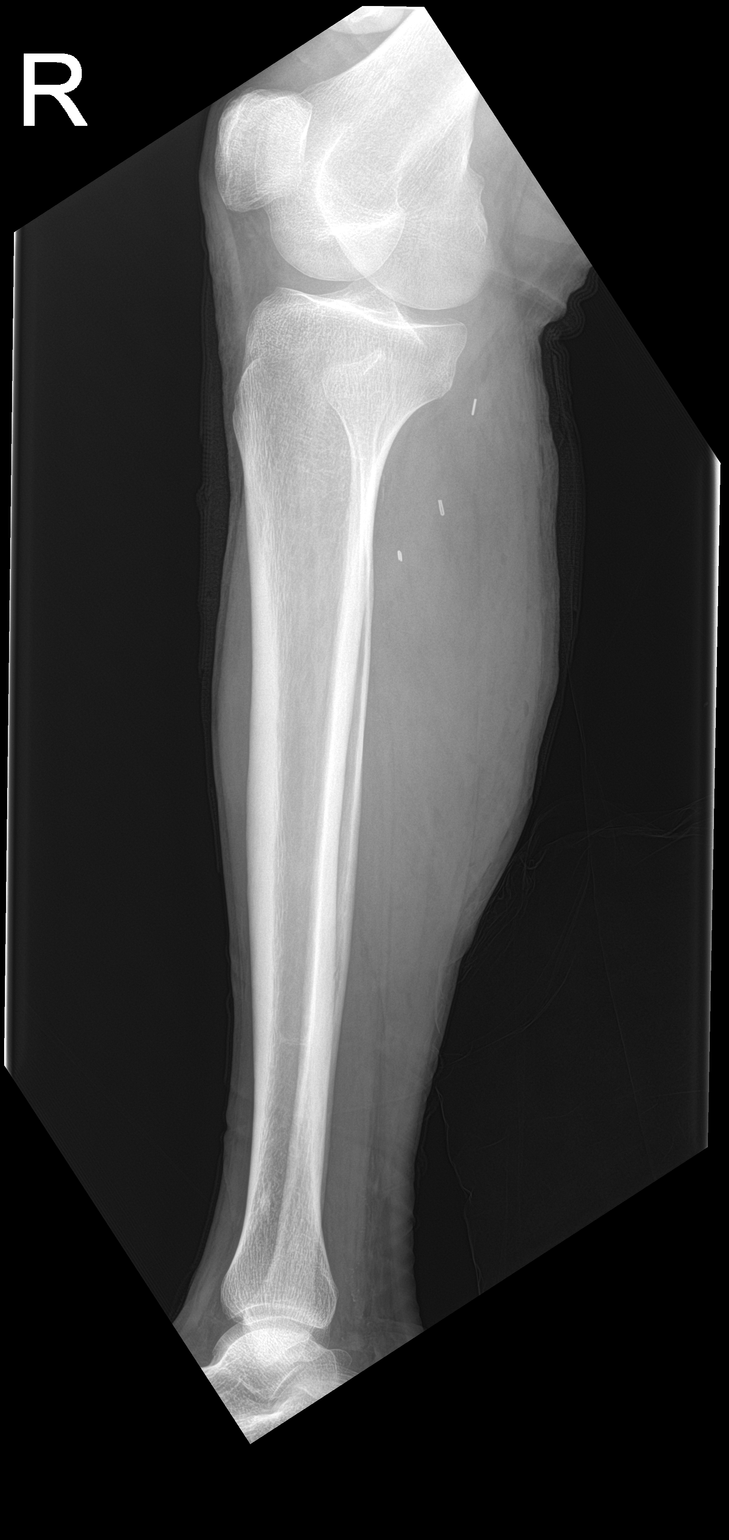

[2 of 2 positions shown; findings below may reference images not displayed]

FINDINGS: Surgical clips are identified in the proximal MEDIAL LOWER leg.
There is soft tissue gas in the MEDIAL aspect of the LOWER leg,
consistent with recent surgery. No retained needle or other foreign
body. Tibia and fibula are unremarkable.
IMPRESSION: Postoperative changes in the MEDIAL proximal LEFT LOWER leg.

No retained metallic foreign body.

## 2021-07-21 ENCOUNTER — Other Ambulatory Visit: Payer: Self-pay | Admitting: Interventional Cardiology

## 2021-09-23 NOTE — Progress Notes (Signed)
?  ?Cardiology Office Note ? ? ?Date:  09/24/2021  ? ?ID:  Joseph Morse, DOB 1950-07-18, MRN [id number] ? ?PCP:  [name], MD  ? ? ?No chief complaint on file. ? ?CAD/AVR ? ?Wt Readings from Last 3 Encounters:  ?09/24/21 170 lb (77.1 kg)  ?06/14/20 172 lb (78 kg)  ?06/09/19 172 lb 12.8 oz (78.4 kg)  ?  ? ?  ?History of Present Illness: ?Joseph Morse is a 71 y.o. male   with CAD.  PCI of RCA in 2011.  ?  ?In 07/2018, he had: ?  ?"Coronary artery bypass grafting x 2 ?Sequential SVG to OM 1 and OM2 ? Aortic valve replacement using a 25 mm Edwards INSPIRIS RESILIA pericardial valve." ?  ?He had PAF post op.  He had sx with that.  He could feel an irregularity, but not too fast. ?  ?Amiodarone was stopped a few months after surgery.  Lisinopril was added back for HTN.  ? ?Denies : Chest pain. Dizziness. Leg edema. Nitroglycerin use. Orthopnea. Palpitations. Paroxysmal nocturnal dyspnea. Shortness of breath. Syncope.   ? ?BP at home has been in the 145/75 range at home.  ? ?Palpitations resolved.  ? ? ? ?Past Medical History:  ?Diagnosis Date  ? Chest pain   ? Coronary artery disease   ? Dyspnea   ? Headache   ? migraines  ? Hernia, abdominal   ? x 2  ? History of kidney stones   ? HTN (hypertension)   ? Hyperlipidemia, mixed   ? Post-nasal drip   ? S/P right coronary artery (RCA) stent placement   ? Severe aortic stenosis   ? ? ?Past Surgical History:  ?Procedure Laterality Date  ? AORTIC VALVE REPLACEMENT N/A 07/30/2018  ? Procedure: AORTIC VALVE REPLACEMENT (AVR);  Surgeon: Alleen Borne, MD;  Location: [facility] OR;  Service: Open Heart Surgery;  Laterality: N/A;  ? CORONARY ARTERY BYPASS GRAFT N/A 07/30/2018  ? Procedure: CORONARY ARTERY BYPASS GRAFTING (CABG)x 2  , using left internal mammary artery and right leg greater saphenous  vein harvested endoscopically;  Surgeon: Alleen Borne, MD;  Location: [facility] OR;  Service: Open Heart Surgery;  Laterality: N/A;  ? HERNIA REPAIR    ? B/L inguinal hernia  ? RIGHT HEART  CATH AND CORONARY ANGIOGRAPHY N/A 07/13/2018  ? Procedure: RIGHT HEART CATH AND CORONARY ANGIOGRAPHY;  Surgeon: Corky Crafts, MD;  Location: [facility] INVASIVE CV LAB;  Service: Cardiovascular;  Laterality: N/A;  ? TEE WITHOUT CARDIOVERSION N/A 07/30/2018  ? Procedure: TRANSESOPHAGEAL ECHOCARDIOGRAM (TEE);  Surgeon: Alleen Borne, MD;  Location: [facility] OR;  Service: Open Heart Surgery;  Laterality: N/A;  ? TONSILLECTOMY    ? ? ? ?Current Outpatient Medications  ?Medication Sig Dispense Refill  ? amoxicillin (AMOXIL) 500 MG capsule 4 capsules 1 hour prior to dental procedure 4 capsule 3  ? aspirin EC 81 MG tablet Take 81 mg by mouth daily.    ? atenolol (TENORMIN) 25 MG tablet Take 1 tablet (25 mg total) by mouth daily. Please keep appointment in May for future appointments. 90 tablet 0  ? atorvastatin (LIPITOR) 40 MG tablet TAKE 1 TABLET BY MOUTH EVERY DAY. Please keep appointment in May for future refills. 90 tablet 0  ? clopidogrel (PLAVIX) 75 MG tablet Take 1 tablet (75 mg total) by mouth daily. 90 tablet 3  ? lisinopril (ZESTRIL) 20 MG tablet Take 1 tablet (20 mg total) by mouth daily. Please keep appointment in May for  future refills. 90 tablet 0  ? oxyCODONE-acetaminophen (PERCOCET) 5-325 MG tablet Take 1 tablet by mouth every 6 (six) hours as needed for severe pain. 20 tablet 0  ? oxymetazoline (AFRIN) 0.05 % nasal spray Place 3 sprays into both nostrils 2 (two) times daily.    ? nitroGLYCERIN (NITROSTAT) 0.4 MG SL tablet Place 1 tablet (0.4 mg total) under the tongue every 5 (five) minutes as needed. 25 tablet 3  ? ?No current facility-administered medications for this visit.  ? ? ?Allergies:   Patient has no known allergies.  ? ? ?Social History:  The patient  reports that he has quit smoking. His smoking use included cigarettes. He has never used smokeless tobacco. He reports that he does not drink alcohol and does not use drugs.  ? ?Family History:  The patient's family history includes CVA in his mother;  Cancer in his maternal grandfather; Heart attack in his father; Hyperlipidemia in his sister.  ? ? ?ROS:  Please see the history of present illness.   Otherwise, review of systems are positive for healthy diet- some dietary indiscretion.   All other systems are reviewed and negative.  ? ? ?PHYSICAL EXAM: ?VS:  BP 136/88   Pulse (!) 51   Ht 5' 9.5" (1.765 m)   Wt 170 lb (77.1 kg)   SpO2 98%   BMI 24.74 kg/m?  , BMI Body mass index is 24.74 kg/m?. ?GEN: Well nourished, well developed, in no acute distress ?HEENT: normal ?Neck: no JVD, carotid bruits, or masses ?Cardiac: RRR; 2/6 systolic murmurs, rubs, or gallops,no edema  ?Respiratory:  clear to auscultation bilaterally, normal work of breathing ?GI: soft, nontender, nondistended, + BS ?MS: no deformity or atrophy ?Skin: warm and dry, no rash ?Neuro:  Strength and sensation are intact ?Psych: euthymic mood, full affect ? ? ?EKG:   ?The ekg ordered today demonstrates sinus bradycardia, LBBB, prolonged PR interval ? ? ?Recent Labs: ?No results found for requested labs within last 8760 hours.  ? ?Lipid Panel ?   ?Component Value Date/Time  ? CHOL 118 06/14/2020 1147  ? TRIG 44 06/14/2020 1147  ? HDL 44 06/14/2020 1147  ? CHOLHDL 2.7 06/14/2020 1147  ? CHOLHDL 2.5 04/24/2016 0911  ? VLDL 9 04/24/2016 0911  ? LDLCALC 63 06/14/2020 1147  ? ?  ?Other studies Reviewed: ?Additional studies/ records that were reviewed today with results demonstrating: LDL 63, HDL 44, TG 44. ? ? ?ASSESSMENT AND PLAN: ? ?CAD: No angina on medical therapy. Continue aggressive secondary prevention.  ?S/p AVR: SBE prophylaxis.   ?HTN: Repeat BP by me showed 149/60.  Increase lisinopril to 40 mg daily.  Check labs  In a week to make sure potassium and creatinine are okay.  Followup inPharmD clinic in 3-4 weeks.  Bring home cuf.  If BP still elevated, may need amlodipine.  ?PAF: No palpitations. Check TSH.  ?Hyperlipidemia: Labs from 2022 reviewed.  The current medical regimen is effective;   continue present plan and medications.  Will recheck labs in a week. ?Former smoker. Screen for AAA.  ? ? ? ?Current medicines are reviewed at length with the patient today.  The patient concerns regarding his medicines were addressed. ? ?The following changes have been made:  as above ? ?Labs/ tests ordered today include:  ?No orders of the defined types were placed in this encounter. ? ? ?Recommend 150 minutes/week of aerobic exercise ?Low fat, low carb, high fiber diet recommended ? ?Disposition:   FU in 1 year  with me; 3-4 weeks HTN clinic ? ? ?Signed, ?Lance Muss, MD  ?09/24/2021 8:27 AM    ?[facility] ?[address] [place], [place], [place]  [address] ?Phone: [phone]; Fax: [phone]  ? ?

## 2021-09-24 ENCOUNTER — Encounter: Payer: Self-pay | Admitting: Interventional Cardiology

## 2021-09-24 ENCOUNTER — Ambulatory Visit: Payer: PPO | Admitting: Interventional Cardiology

## 2021-09-24 VITALS — BP 136/88 | HR 51 | Ht 69.5 in | Wt 170.0 lb

## 2021-09-24 DIAGNOSIS — Z87891 Personal history of nicotine dependence: Secondary | ICD-10-CM | POA: Diagnosis not present

## 2021-09-24 DIAGNOSIS — E782 Mixed hyperlipidemia: Secondary | ICD-10-CM

## 2021-09-24 DIAGNOSIS — Z136 Encounter for screening for cardiovascular disorders: Secondary | ICD-10-CM

## 2021-09-24 DIAGNOSIS — I10 Essential (primary) hypertension: Secondary | ICD-10-CM

## 2021-09-24 DIAGNOSIS — Z952 Presence of prosthetic heart valve: Secondary | ICD-10-CM

## 2021-09-24 DIAGNOSIS — I480 Paroxysmal atrial fibrillation: Secondary | ICD-10-CM | POA: Diagnosis not present

## 2021-09-24 DIAGNOSIS — I25118 Atherosclerotic heart disease of native coronary artery with other forms of angina pectoris: Secondary | ICD-10-CM | POA: Diagnosis not present

## 2021-09-24 DIAGNOSIS — Z7901 Long term (current) use of anticoagulants: Secondary | ICD-10-CM | POA: Diagnosis not present

## 2021-09-24 MED ORDER — LISINOPRIL 40 MG PO TABS
40.0000 mg | ORAL_TABLET | Freq: Every day | ORAL | 3 refills | Status: DC
Start: 2021-09-24 — End: 2021-10-29

## 2021-09-24 NOTE — Patient Instructions (Signed)
Medication Instructions:  ?Your physician has recommended you make the following change in your medication:  ?Increase lisinopril to 40 mg by mouth daily ? ?*If you need a refill on your cardiac medications before your next appointment, please call your pharmacy* ? ? ?Lab Work: ?Your physician recommends that you return for lab work in: about 1 week.  CMET, CBC, Lipids, TSH.  This will be fasting.  The lab opens at 7:15 AM ? ?If you have labs (blood work) drawn today and your tests are completely normal, you will receive your results only by: ?MyChart Message (if you have MyChart) OR ?A paper copy in the mail ?If you have any lab test that is abnormal or we need to change your treatment, we will call you to review the results. ? ? ?Testing/Procedures: ?Your physician has requested that you have an abdominal aorta duplex. During this test, an ultrasound is used to evaluate the aorta. Allow 30 minutes for this exam. Do not eat after midnight the day before and avoid carbonated beverages ? ? ? ?Follow-Up: ?At [facility], you and your health needs are our priority.  As part of our continuing mission to provide you with exceptional heart care, we have created designated Provider Care Teams.  These Care Teams include your primary Cardiologist (physician) and Advanced Practice Providers (APPs -  Physician Assistants and Nurse Practitioners) who all work together to provide you with the care you need, when you need it. ? ?We recommend signing up for the patient portal called "MyChart".  Sign up information is provided on this After Visit Summary.  MyChart is used to connect with patients for Virtual Visits (Telemedicine).  Patients are able to view lab/test results, encounter notes, upcoming appointments, etc.  Non-urgent messages can be sent to your provider as well.   ?To learn more about what you can do with MyChart, go to [email or web address].   ? ?Your next appointment:   ?12 month(s) ? ?The format for your next  appointment:   ?In Person ? ?Provider:   ?Lance Muss, MD H  ? ? ?Other Instructions ?You have been referred to Hypertension clinic in our office.  Please schedule new patient appointment for 3-4 weeks from now. ?Check blood pressure at home and keep record of readings.  Bring readings and your home blood pressure monitor to appointment with pharmacist.  ? ? ?Important Information About Sugar ? ? ? ? ? ? ?

## 2021-10-02 ENCOUNTER — Other Ambulatory Visit: Payer: PPO | Admitting: *Deleted

## 2021-10-02 DIAGNOSIS — Z952 Presence of prosthetic heart valve: Secondary | ICD-10-CM | POA: Diagnosis not present

## 2021-10-02 DIAGNOSIS — I480 Paroxysmal atrial fibrillation: Secondary | ICD-10-CM

## 2021-10-02 DIAGNOSIS — E782 Mixed hyperlipidemia: Secondary | ICD-10-CM

## 2021-10-02 DIAGNOSIS — Z7901 Long term (current) use of anticoagulants: Secondary | ICD-10-CM | POA: Diagnosis not present

## 2021-10-02 LAB — CBC
Hematocrit: 42.1 % (ref 37.5–51.0)
Hemoglobin: 14.3 g/dL (ref 13.0–17.7)
MCH: 30.8 pg (ref 26.6–33.0)
MCHC: 34 g/dL (ref 31.5–35.7)
MCV: 91 fL (ref 79–97)
Platelets: 292 10*3/uL (ref 150–450)
RBC: 4.65 x10E6/uL (ref 4.14–5.80)
RDW: 12.5 % (ref 11.6–15.4)
WBC: 7 10*3/uL (ref 3.4–10.8)

## 2021-10-02 LAB — LIPID PANEL
Chol/HDL Ratio: 3.2 ratio (ref 0.0–5.0)
Cholesterol, Total: 135 mg/dL (ref 100–199)
HDL: 42 mg/dL (ref >39)
LDL Chol Calc (NIH): 80 mg/dL (ref 0–99)
Triglycerides: 60 mg/dL (ref 0–149)
VLDL Cholesterol Cal: 13 mg/dL (ref 5–40)

## 2021-10-02 LAB — COMPREHENSIVE METABOLIC PANEL
ALT: 21 IU/L (ref 0–44)
AST: 24 IU/L (ref 0–40)
Albumin/Globulin Ratio: 2.6 — ABNORMAL HIGH (ref 1.2–2.2)
Albumin: 4.9 g/dL — ABNORMAL HIGH (ref 3.7–4.7)
Alkaline Phosphatase: 68 IU/L (ref 44–121)
BUN/Creatinine Ratio: 14 (ref 10–24)
BUN: 17 mg/dL (ref 8–27)
Bilirubin Total: 0.8 mg/dL (ref 0.0–1.2)
CO2: 26 mmol/L (ref 20–29)
Calcium: 9.5 mg/dL (ref 8.6–10.2)
Chloride: 102 mmol/L (ref 96–106)
Creatinine, Ser: 1.24 mg/dL (ref 0.76–1.27)
Globulin, Total: 1.9 g/dL (ref 1.5–4.5)
Glucose: 113 mg/dL — ABNORMAL HIGH (ref 70–99)
Potassium: 5 mmol/L (ref 3.5–5.2)
Sodium: 139 mmol/L (ref 134–144)
Total Protein: 6.8 g/dL (ref 6.0–8.5)
eGFR: 62 mL/min/{1.73_m2} (ref >59)

## 2021-10-02 LAB — TSH: TSH: 1.34 u[IU]/mL (ref 0.450–4.500)

## 2021-10-03 ENCOUNTER — Telehealth: Payer: Self-pay | Admitting: *Deleted

## 2021-10-03 DIAGNOSIS — E782 Mixed hyperlipidemia: Secondary | ICD-10-CM

## 2021-10-03 MED ORDER — ROSUVASTATIN CALCIUM 40 MG PO TABS: 40.0000 mg | ORAL_TABLET | Freq: Every day | ORAL | 3 refills | Status: DC

## 2021-10-03 NOTE — Telephone Encounter (Signed)
-----   Message from Jettie Booze, MD sent at 10/03/2021  9:30 AM EDT ----- ?Electrolytes, CBC , TSH well controlled.  LDL has increased from 63 to 80.  Would switch from atorvastatin to rosuvastatin 40 mg daily for better lipid lowering therapy.  Recheck lipids and liver in 3 months after switch.  ?

## 2021-10-03 NOTE — Telephone Encounter (Signed)
I spoke with patient's wife and reviewed results/recommendations with her.  Prescription [facility] [address] in [place].  Patient will come in for fasting lab work on 01/01/22 ?

## 2021-10-10 ENCOUNTER — Other Ambulatory Visit: Payer: Self-pay | Admitting: Interventional Cardiology

## 2021-10-10 DIAGNOSIS — Z87891 Personal history of nicotine dependence: Secondary | ICD-10-CM

## 2021-10-11 ENCOUNTER — Ambulatory Visit (HOSPITAL_COMMUNITY)
Admission: RE | Admit: 2021-10-11 | Discharge: 2021-10-11 | Disposition: A | Discharge status: Home / Self Care | Payer: PPO | Source: Ambulatory Visit | Attending: Cardiovascular Disease | Admitting: Cardiovascular Disease

## 2021-10-11 ENCOUNTER — Other Ambulatory Visit: Payer: Self-pay | Admitting: Interventional Cardiology

## 2021-10-11 DIAGNOSIS — Z87891 Personal history of nicotine dependence: Secondary | ICD-10-CM

## 2021-10-11 DIAGNOSIS — Z136 Encounter for screening for cardiovascular disorders: Secondary | ICD-10-CM | POA: Diagnosis not present

## 2021-10-17 ENCOUNTER — Ambulatory Visit: Payer: PPO | Admitting: Pharmacist

## 2021-10-17 VITALS — BP 138/60 | HR 49

## 2021-10-17 DIAGNOSIS — I10 Essential (primary) hypertension: Secondary | ICD-10-CM | POA: Diagnosis not present

## 2021-10-17 NOTE — Patient Instructions (Signed)
Please decrease atenolol to 12.5mg  daily. Continue lisinopril 40mg  daily Continue checking blood pressure a few days a week Call me at [phone] with any questions

## 2021-10-17 NOTE — Progress Notes (Signed)
Patient ID: Joseph Morse                 DOB: 1950-12-02                      MRN: [id number]     HPI: Joseph Morse is a 71 y.o. male referred by Dr. Irish Lack to HTN clinic. PMH is significant for CAD s/p CABG, AVR using pericardial valve, post op afib, HTN and HLD. Patient saw Dr. Irish Lack on 09/24/21. BP in clinic was 136/88 HR 51. Patient reported BP at home around 145/75. Lisinopril was increased to 40mg  daily. Repeat BMP was stable.   Patient presents today to HTN clinic. He brings in his home BP monitor and his readings. Readings are mostly in the 110's, with the rare 130-140 reading. He did have one day that systolic dropped into the 90's after playing golf. He plays gold twice a week. Walks the course, walks about 7 miles. Rare dizziness if he's out in the sun and bends over. No swelling or SOB. No NSAID use. Does use topical afrin. 2 cups of coffee per day.   Clinic cuff: 144/70 Home cuff: 138/64 Clinic cuff: 138/60 Home cuff: 143/64  Current HTN meds: lisinopril 40mg  daily, atenolol 25mg  daily Previously tried: carvedilol BP goal: <130/80  Family History: The patient's family history includes CVA in his mother; Cancer in his maternal grandfather; Heart attack in his father; Hyperlipidemia in his sister.   Social History: :  The patient  reports that he has quit smoking. His smoking use included cigarettes. He has never used smokeless tobacco. He reports that he does not drink alcohol and does not use drugs.   Diet: coffee (2 large cups), zero sugar ginger ale  Exercise:  golf twice a week (walks course), walks neighborhood on occasion (1-2 miles)  Home BP readings: 133/71, 127/73, 117/64, 111/64, 123/69, 93/53, 115/62, 110/61, 125/69, 126/66, 120/65, 130/67, 142/73, 118/67, 118/65, 115/59, 124/67, 103/54, 100/51, 108/65, 115/65 HR 48-60's mainly in the low 50's  Wt Readings from Last 3 Encounters:  09/24/21 170 lb (77.1 kg)  06/14/20 172 lb (78 kg)  06/09/19 172 lb 12.8  oz (78.4 kg)   BP Readings from Last 3 Encounters:  09/24/21 136/88  06/14/20 132/90  06/09/19 136/76   Pulse Readings from Last 3 Encounters:  09/24/21 (!) 51  06/14/20 (!) 52  06/09/19 (!) 58    Renal function: CrCl cannot be calculated (Unknown ideal weight.).  Past Medical History:  Diagnosis Date   Chest pain    Coronary artery disease    Dyspnea    Headache    migraines   Hernia, abdominal    x 2   History of kidney stones    HTN (hypertension)    Hyperlipidemia, mixed    Post-nasal drip    S/P right coronary artery (RCA) stent placement    Severe aortic stenosis     Current Outpatient Medications on File Prior to Visit  Medication Sig Dispense Refill   amoxicillin (AMOXIL) 500 MG capsule 4 capsules 1 hour prior to dental procedure 4 capsule 3   aspirin EC 81 MG tablet Take 81 mg by mouth daily.     atenolol (TENORMIN) 25 MG tablet Take 1 tablet (25 mg total) by mouth daily. Please keep appointment in May for future appointments. 90 tablet 0   clopidogrel (PLAVIX) 75 MG tablet Take 1 tablet (75 mg total) by mouth daily. 90 tablet 3  lisinopril (ZESTRIL) 40 MG tablet Take 1 tablet (40 mg total) by mouth daily. 90 tablet 3   nitroGLYCERIN (NITROSTAT) 0.4 MG SL tablet Place 1 tablet (0.4 mg total) under the tongue every 5 (five) minutes as needed. 25 tablet 3   oxyCODONE-acetaminophen (PERCOCET) 5-325 MG tablet Take 1 tablet by mouth every 6 (six) hours as needed for severe pain. 20 tablet 0   oxymetazoline (AFRIN) 0.05 % nasal spray Place 3 sprays into both nostrils 2 (two) times daily.     rosuvastatin (CRESTOR) 40 MG tablet Take 1 tablet (40 mg total) by mouth daily. 90 tablet 3   No current facility-administered medications on file prior to visit.    No Known Allergies  There were no vitals taken for this visit.   Assessment/Plan:  1. Hypertension - Blood pressure in clinic is above goal of <130/80. However, his home readings are pretty consistently at  goal. Average BP is well controlled. His HR is low. Sometimes feels fatigued. HR in office is 49. Decrease atenolol to 12.5mg  daily. If palpitations return, patient is aware to increase back to 25mg  daily. Continue lisinopril 40mg  daily. Continue checking BP every few days. Follow up as needed.   Thank you  Ramond Dial, Pharm.D, BCPS, CPP [place]  [id number] [place], [place] [address]  Phone: [phone]; Fax: [phone]

## 2021-10-27 ENCOUNTER — Other Ambulatory Visit: Payer: Self-pay | Admitting: Interventional Cardiology

## 2021-10-28 ENCOUNTER — Other Ambulatory Visit: Payer: Self-pay | Admitting: Interventional Cardiology

## 2021-10-28 NOTE — Telephone Encounter (Signed)
Outpatient Medication Detail   Disp Refills Start End   clopidogrel (PLAVIX) 75 MG tablet 90 tablet 3 10/26/2020    Sig - Route: Take 1 tablet (75 mg total) by mouth daily. - Oral   Sent to pharmacy as: clopidogrel (PLAVIX) 75 MG tablet   E-Prescribing Status: Receipt confirmed by pharmacy (10/26/2020 10:18 AM EDT)    Pharmacy  [id number] - [place],  - [address]

## 2021-10-29 MED ORDER — LISINOPRIL 40 MG PO TABS: 40.0000 mg | ORAL_TABLET | Freq: Every day | ORAL | 3 refills | Status: DC

## 2022-01-01 ENCOUNTER — Other Ambulatory Visit: Payer: PPO

## 2022-01-01 DIAGNOSIS — E782 Mixed hyperlipidemia: Secondary | ICD-10-CM

## 2022-01-01 LAB — HEPATIC FUNCTION PANEL
ALT: 18 IU/L (ref 0–44)
AST: 25 IU/L (ref 0–40)
Albumin: 4.9 g/dL — ABNORMAL HIGH (ref 3.8–4.8)
Alkaline Phosphatase: 61 IU/L (ref 44–121)
Bilirubin Total: 0.7 mg/dL (ref 0.0–1.2)
Bilirubin, Direct: 0.2 mg/dL (ref 0.00–0.40)
Total Protein: 6.8 g/dL (ref 6.0–8.5)

## 2022-01-01 LAB — LIPID PANEL
Chol/HDL Ratio: 2.9 ratio (ref 0.0–5.0)
Cholesterol, Total: 121 mg/dL (ref 100–199)
HDL: 42 mg/dL (ref >39)
LDL Chol Calc (NIH): 65 mg/dL (ref 0–99)
Triglycerides: 64 mg/dL (ref 0–149)
VLDL Cholesterol Cal: 14 mg/dL (ref 5–40)

## 2022-07-27 ENCOUNTER — Other Ambulatory Visit: Payer: Self-pay | Admitting: Interventional Cardiology

## 2022-10-28 ENCOUNTER — Other Ambulatory Visit: Payer: Self-pay | Admitting: Interventional Cardiology

## 2023-01-01 ENCOUNTER — Other Ambulatory Visit: Payer: Self-pay | Admitting: Interventional Cardiology

## 2023-02-25 ENCOUNTER — Ambulatory Visit: Payer: PPO | Admitting: Interventional Cardiology

## 2023-03-24 NOTE — Progress Notes (Unsigned)
Cardiology Office Note:    Date:  03/25/2023   ID:  Joseph Morse, DOB 01-21-1951, MRN [id number]  PCP:  Noberto Retort, MD  Cardiologist:  Norman Herrlich, MD    Referring MD: Noberto Retort, MD    ASSESSMENT:    1. Coronary artery disease involving native coronary artery of native heart with other form of angina pectoris (HCC)   2. S/P CABG x 2   3. S/P AVR   4. Essential hypertension, benign   5. Mixed hyperlipidemia    PLAN:    In order of problems listed above:  Ormal can continues to do well approaching 5 years from CABG and surgical AVR Asymptomatic New York Heart Association class I He will continue his chronic dual antiplatelet therapy antihypertensives statin withdrawal low-dose atenolol I think it would improve his exercise ability Echocardiogram at the 5-year anniversary of surgery Check labs today including lipids CMP and for optimization apolipoprotein B and LP(a) Continue his current antihypertensive Zestril   Next appointment: 1year I asked him to sign up for MyChart to communicate with [place] in this fashion   Medication Adjustments/Labs and Tests Ordered: Current medicines are reviewed at length with the patient today.  Concerns regarding medicines are outlined above.  Orders Placed This Encounter  Procedures   EKG 12-Lead   No orders of the defined types were placed in this encounter.    History of Present Illness:    Joseph Morse is a 72 y.o. male with a hx of CAD with PCI of the right coronary artery 2001 coronary bypass surgery March 2020 with sequential vein graft to the first and second marginal and tissue aortic valve Edwards Inspira's Resilia 25 mm pericardial valve with postoperative atrial fibrillation hypertension and hyperlipidemia last seen 10/17/2021. Compliance with diet, lifestyle and medications: Yes  He is approaching the 5-year anniversary of his valve replacement and will do his echocardiogram March He remains very  vigorous and active playing golf walking every day and has had no cardiovascular symptoms of edema shortness of breath chest pain palpitation or syncope. He feels that he is held back in his activities by the beta-blocker he is remote from his surgery and I think he can withdraw slowly over 2 weeks and stop He will self monitor with a smart watch He tolerates lipid-lowering without muscle pain or weakness he is n.p.o. and will check his lipids today Past Medical History:  Diagnosis Date   Chest pain    Coronary artery disease    Dyspnea    Headache    migraines   Hernia, abdominal    x 2   History of kidney stones    HTN (hypertension)    Hyperlipidemia, mixed    Post-nasal drip    S/P right coronary artery (RCA) stent placement    Severe aortic stenosis     Current Medications: Current Meds  Medication Sig   aspirin EC 81 MG tablet Take 81 mg by mouth daily.   atenolol (TENORMIN) 25 MG tablet TAKE 1 TABLET (25 MG TOTAL) BY MOUTH DAILY.   clopidogrel (PLAVIX) 75 MG tablet TAKE 1 TABLET BY MOUTH EVERY DAY   lisinopril (ZESTRIL) 40 MG tablet TAKE 1 TABLET BY MOUTH EVERY DAY   nitroGLYCERIN (NITROSTAT) 0.4 MG SL tablet Place 1 tablet (0.4 mg total) under the tongue every 5 (five) minutes as needed.   oxymetazoline (AFRIN) 0.05 % nasal spray Place 3 sprays into both nostrils 2 (two) times daily.   rosuvastatin (  CRESTOR) 40 MG tablet TAKE 1 TABLET BY MOUTH EVERY DAY      EKGs/Labs/Other Studies Reviewed:    The following studies were reviewed today:  Cardiac Studies & Procedures   CARDIAC CATHETERIZATION  CARDIAC CATHETERIZATION 07/13/2018  Narrative  Mid RCA stent is patent with mild in-stent restenosis  Prox RCA lesion is 30% stenosed.  Ost RPDA lesion is 25% stenosed.  Prox Cx lesion is 80% stenosed. This lesion appears unchanged from the 2010 cardiac cath.  Ost Cx to Prox Cx lesion is 50% stenosed.  Mid Cx to Dist Cx lesion is 90% stenosed. This lesion appears  unchanged from the 2010 cardiac cath.  2nd Mrg lesion is 75% stenosed. Lat 2nd Mrg lesion is 75% stenosed. This bifurcation area in the OM represents new disease compared to 2010.  Prox LAD lesion is 25% stenosed.  Unable to cross aortic valve despite use of straight wire with AL-1 catheter.  Given the dramatic worsening of his symptoms, and the changes on his echocardiogram in terms of his aortic stenosis from 2018, I suspect his symptoms are coming more from aortic stenosis rather than coronary artery disease.  He has only had mild progression of his coronary disease over 10 years.  Will refer to surgery for aortic valve replacement with bypass to the circumflex territory.  He will need to avoid any strenuous activities given the presyncope that he has felt, as he walks up hills.  Continue Plavix for now.  Once he sees the surgeon and a date is set for surgery, they can hold the Plavix 5 days prior to surgery, at that point.  Findings Coronary Findings Diagnostic  Dominance: Right  Left Anterior Descending There is mild diffuse disease throughout the vessel. Prox LAD lesion is 25% stenosed. The lesion is eccentric. The lesion is moderately calcified.  Left Circumflex Ost Cx to Prox Cx lesion is 50% stenosed. Prox Cx lesion is 80% stenosed. Mid Cx to Dist Cx lesion is 90% stenosed.  Second Obtuse Marginal Branch 2nd Mrg lesion is 75% stenosed.  Lateral Second Obtuse Marginal Branch Lat 2nd Mrg lesion is 75% stenosed.  Right Coronary Artery Prox RCA lesion is 30% stenosed. Mid RCA lesion is 30% stenosed. The lesion was previously treated using a drug eluting stent over 2 years ago.  Right Posterior Descending Artery Ost RPDA lesion is 25% stenosed.  Intervention  No interventions have been documented.   STRESS TESTS  NM MYOCAR MULTI W/SPECT W 10/03/2008   ECHOCARDIOGRAM  ECHOCARDIOGRAM COMPLETE 11/15/2018  Narrative ECHOCARDIOGRAM REPORT    Patient Name:    ERCOLE IDEMA Date of Exam: 11/15/2018 Medical Rec #:  [id number]        Height:       69.5 in Accession #:    [id number]       Weight:       170.0 lb Date of Birth:  August 14, 1950        BSA:          1.94 m Patient Age:    68 years         BP:           172/75 mmHg Patient Gender: M                HR:           54 bpm. Exam Location:  [address]   Procedure: 2D Echo, Cardiac Doppler and Color Doppler  Indications:    I35.9 AV disorder  History:  Patient has prior history of Echocardiogram examinations, most recent 07/12/2018. Prior CABG Paroxysmal atrial fibrillation and LBBB AVR (25mm Edwards Inspiris Resilia) Risk Factors: Hypertension and Dyslipidemia.  Sonographer:    Samule Ohm RDCS Referring Phys: [id number] JAYADEEP S VARANASI  IMPRESSIONS   1. The left ventricle has low normal systolic function, with an ejection fraction of 50-55%. The cavity size was mildly dilated. There is mildly increased left ventricular wall thickness. Left ventricular diastolic parameters were normal. 2. Abnormal septal motion. 3. The right ventricle has normal systolic function. The cavity was normal. There is no increase in right ventricular wall thickness. 4. Left atrial size was mildly dilated. 5. Mild thickening of the mitral valve leaflet. Mild calcification of the mitral valve leaflet. 6. Normal appearing bioprosthetic AVR 25 mm Edwards Inpiris Resilia withno PVL and stable gradients. 7. The aortic root is normal in size and structure.  FINDINGS Left Ventricle: The left ventricle has low normal systolic function, with an ejection fraction of 50-55%. The cavity size was mildly dilated. There is mildly increased left ventricular wall thickness. Left ventricular diastolic parameters were normal. Abnormal septal motion.  Right Ventricle: The right ventricle has normal systolic function. The cavity was normal. There is no increase in right ventricular wall thickness.  Left Atrium: Left  atrial size was mildly dilated.  Right Atrium: Right atrial size was normal in size. Right atrial pressure is estimated at 10 mmHg.  Interatrial Septum: No atrial level shunt detected by color flow Doppler.  Pericardium: There is no evidence of pericardial effusion.  Mitral Valve: The mitral valve is normal in structure. Mild thickening of the mitral valve leaflet. Mild calcification of the mitral valve leaflet. Mitral valve regurgitation is trivial by color flow Doppler.  Tricuspid Valve: The tricuspid valve is normal in structure. Tricuspid valve regurgitation is mild by color flow Doppler.  Aortic Valve: The aortic valve has been repaired/replaced Aortic valve regurgitation was not visualized by color flow Doppler. There is no evidence of aortic valve stenosis. Normal appearing bioprosthetic AVR 25 mm Edwards Inpiris Resilia withno PVL and stable gradients.  Pulmonic Valve: The pulmonic valve was grossly normal. Pulmonic valve regurgitation is mild by color flow Doppler.  Aorta: The aortic root is normal in size and structure.   +--------------+--------++ LEFT VENTRICLE         +----------------+---------++ +--------------+--------++ Diastology                PLAX 2D                +----------------+---------++ +--------------+--------++ LV e' lateral:  8.16 cm/s LVIDd:        4.80 cm  +----------------+---------++ +--------------+--------++ LV E/e' lateral:13.4      LVIDs:        3.40 cm  +----------------+---------++ +--------------+--------++ LV e' medial:   5.33 cm/s LV PW:        1.00 cm  +----------------+---------++ +--------------+--------++ LV E/e' medial: 20.5      LV IVS:       1.20 cm  +----------------+---------++ +--------------+--------++ LVOT diam:    2.20 cm  +--------------+--------++ LV SV:        60 ml    +--------------+--------++ LV SV Index:  30.78    +--------------+--------++ LVOT Area:     3.80 cm +--------------+--------++                        +--------------+--------++  +---------------+---------++ RIGHT VENTRICLE          +---------------+---------++  RV S prime:    8.92 cm/s +---------------+---------++ TAPSE (M-mode):1.7 cm    +---------------+---------++  +---------------+-------++-----------++ LEFT ATRIUM           Index       +---------------+-------++-----------++ LA diam:       4.10 cm2.12 cm/m  +---------------+-------++-----------++ LA Vol (A2C):  88.1 ml45.47 ml/m +---------------+-------++-----------++ LA Vol (A4C):  95.6 ml49.34 ml/m +---------------+-------++-----------++ LA Biplane Vol:94.2 ml48.61 ml/m +---------------+-------++-----------++ +------------+---------++-----------++ RIGHT ATRIUM         Index       +------------+---------++-----------++ RA Pressure:3.00 mmHg            +------------+---------++-----------++ RA Area:    20.30 cm            +------------+---------++-----------++ RA Volume:  60.90 ml 31.43 ml/m +------------+---------++-----------++ +------------------+------------++ AORTIC VALVE                   +------------------+------------++ AV Area (Vmax):   1.54 cm     +------------------+------------++ AV Area (Vmean):  1.65 cm     +------------------+------------++ AV Area (VTI):    1.61 cm     +------------------+------------++ AV Vmax:          211.33 cm/s  +------------------+------------++ AV Vmean:         134.000 cm/s +------------------+------------++ AV VTI:           0.458 m      +------------------+------------++ AV Peak Grad:     17.9 mmHg    +------------------+------------++ AV Mean Grad:     8.7 mmHg     +------------------+------------++ LVOT Vmax:        85.50 cm/s   +------------------+------------++ LVOT Vmean:       58.000 cm/s   +------------------+------------++ LVOT VTI:         0.194 m      +------------------+------------++ LVOT/AV VTI ratio:0.42         +------------------+------------++  +-------------+-------++ AORTA                +-------------+-------++ Ao Root diam:3.60 cm +-------------+-------++ Ao Asc diam: 3.60 cm +-------------+-------++  +--------------+--------++    +---------------+---------++ MITRAL VALVE              TRICUSPID VALVE          +--------------+--------++    +---------------+---------++ MV Area (PHT):            Estimated RAP: 3.00 mmHg +--------------+--------++    +---------------+---------++                        +--------------+--------++    +--------------+-------+ MV Decel Time:275 msec    SHUNTS                +--------------+--------++    +--------------+-------+ +--------------+-----------++ Systemic VTI: 0.19 m  MV E velocity:109.00 cm/s +--------------+-------+ +--------------+-----------++ Systemic Diam:2.20 cm MV A velocity:68.10 cm/s  +--------------+-------+ +--------------+-----------++ MV E/A ratio: 1.60        +--------------+-----------++  +---------+-------+ IVC              +---------+-------+ IVC diam:1.00 cm +---------+-------+   Charlton Haws MD Electronically signed by Charlton Haws MD Signature Date/Time: 11/15/2018/2:47:51 PM    Final   TEE  ECHO INTRAOPERATIVE TEE 07/30/2018  Narrative *INTRAOPERATIVE TRANSESOPHAGEAL REPORT *    Patient Name:   LAMONTE ROSENCRANTZ Date of Exam: 07/30/2018 Medical Rec #:  [id number]        Height:       69.5 in Accession #:    [id number]  Weight:       170.7 lb Date of Birth:  [birth year]        BSA:          1.94 m Patient Age:    67 years         BP:           170/63 mmHg Patient Gender: M                HR:           50 bpm. Exam Location:  Inpatient  Transesophogeal exam was perform intraoperatively during  surgical procedure. Patient was closely monitored under general anesthesia during the entirety of examination.  Indications:     CABG. Aortic Valve Replacement History:         CAD, Severe AS; Risk Factors: Hypertension and Dyslipidemia. Performing Phys: [id number] Alleen Borne Diagnosing Phys: Gaynelle Adu MD  Complications: No known complications during this procedure. POST-OP IMPRESSIONS - Left Ventricle: The left ventricle is unchanged from pre-bypass. - Aortic Valve: No stenosis present. A bioprosthetic valve was placed, leaflets thin and leaflets are freely mobile. - Mitral Valve: The mitral valve appears unchanged from pre-bypass. - Tricuspid Valve: The tricuspid valve appears unchanged from pre-bypass. - Comments: Mean gradient across the AV is now .  PRE-OP FINDINGS Left Ventricle: The left ventricle has normal systolic function, with an ejection fraction of 60-65%. The cavity size was mildly dilated. There is mildly increased left ventricular wall thickness. Right Ventricle: The right ventricle has normal systolic function. The cavity was normal. There is no increase in right ventricular wall thickness. Left Atrium: Left atrial size was normal in size. Right Atrium: Right atrial size was normal in size. Right atrial pressure is estimated at 10 mmHg. Interatrial Septum: Evidence of atrial level shunting detected by color flow Doppler. A small patent foramen ovale is detected with predominantly left to right shunting across the atrial septum. Pericardium: There is no evidence of pericardial effusion. Mitral Valve: The mitral valve is normal in structure. No thickening of the mitral valve leaflet. No calcification of the mitral valve leaflet. Mitral valve regurgitation is trivial by color flow Doppler. Tricuspid Valve: The tricuspid valve was normal in structure. Tricuspid valve regurgitation was not visualized by color flow Doppler. Aortic Valve: The aortic valveis bicuspid  There is Severely thickening of the aortic valve and There is Severe calcifcation of the aortic valve Aortic valve regurgitation is moderate by color flow Doppler. The jet is anteriorly-directed. There is severe stenosis of the aortic valve, with a calculated valve area of 1.52 cm. Pulmonic Valve: The pulmonic valve was normal in structure. Pulmonic valve regurgitation is not visualized by color flow Doppler.  LEFT VENTRICLE PLAX 2D (Teich) LV EF:          70.4 % LVIDd:          5.70 cm LVIDs:          3.40 cm LVOT diam:      2.10 cm LV SV:          113 ml LVOT Area:      3.46 cm  AORTIC VALVE AV Area (Vmax):    1.67 cm AV Area (Vmean):   1.37 cm AV Area (VTI):     1.52 cm AV Vmax:           310.50 cm/s AV Vmean:          209.000 cm/s AV VTI:  0.859 m AV Peak Grad:      38.6 mmHg AV Mean Grad:      25.0 mmHg LVOT Vmax:         150.00 cm/s LVOT Vmean:        82.500 cm/s LVOT VTI:          0.376 m LVOT/AV VTI ratio: 0.44   Gaynelle Adu MD Electronically signed by Gaynelle Adu MD Signature Date/Time: 07/30/2018/3:09:24 PM    Final   MONITORS  LONG TERM MONITOR (3-14 DAYS) 08/07/2020  Narrative  Normal sinus rhythm with rare PACs and PVCs.  Short episodes of SVT, up to 28 seconds.  No atrial fibrillation noted.  Average HR of 57.  Difficult to add more rate slowing drugs given low average HR.   Patch Wear Time:  13 days and 14 hours (2022-02-21T19:52:58-0500 to 2022-03-07T10:23:31-0500)  Patient had a min HR of 42 bpm, max HR of 138 bpm, and avg HR of 57 bpm. Predominant underlying rhythm was Sinus Rhythm. 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 39 Supraventricular Tachycardia runs occurred, the run with the fastest interval lasting 7 beats with a max rate of 138 bpm, the longest lasting 27.9 secs with an avg rate of 91 bpm. Isolated SVEs were rare (<1.0%), SVE Couplets were rare (<1.0%), and SVE Triplets  were rare (<1.0%). Isolated VEs were rare (<1.0%), VE Couplets were rare (<1.0%), and no VE Triplets were present.           EKG Interpretation Date/Time:  Wednesday March 25 2023 11:19:53 EDT Ventricular Rate:  57 PR Interval:  224 QRS Duration:  156 QT Interval:  488 QTC Calculation: 474 R Axis:   -44  Text Interpretation: Sinus bradycardia with 1st degree A-V block Left axis deviation Left bundle branch block When compared with ECG of 01-Aug-2018 20:57, Sinus rhythm has replaced Atrial fibrillation Vent. rate has decreased BY  35 BPM T wave amplitude has decreased in Anterior leads Confirmed by Norman Herrlich ([id number]) on 03/25/2023 11:35:41 AM   His most recent EKG from 09/24/2021 indeed showed sinus rhythm in the atrial fibrillation early postoperative atrial fibrillation Recent Labs: No results found for requested labs within last 365 days.  Recent Lipid Panel    Component Value Date/Time   CHOL 121 01/01/2022 0739   TRIG 64 01/01/2022 0739   HDL 42 01/01/2022 0739   CHOLHDL 2.9 01/01/2022 0739   CHOLHDL 2.5 04/24/2016 0911   VLDL 9 04/24/2016 0911   LDLCALC 65 01/01/2022 0739    Physical Exam:    VS:  BP 136/78 (BP Location: Left Arm, Patient Position: Sitting, Cuff Size: Normal)   Pulse (!) 58   Resp 16   Ht 5\' 9"  (1.753 m)   Wt 165 lb 9.6 oz (75.1 kg)   SpO2 98%   BMI 24.45 kg/m     Wt Readings from Last 3 Encounters:  03/25/23 165 lb 9.6 oz (75.1 kg)  09/24/21 170 lb (77.1 kg)  06/14/20 172 lb (78 kg)     GEN: He appears quite healthy fit younger than his age well nourished, well developed in no acute distress has no arcus senilis xanthoma or xanthelasma HEENT: Normal NECK: No JVD; No carotid bruits LYMPHATICS: No lymphadenopathy CARDIAC: RRR, no murmurs, rubs, gallops RESPIRATORY:  Clear to auscultation without rales, wheezing or rhonchi  ABDOMEN: Soft, non-tender, non-distended MUSCULOSKELETAL:  No edema; No deformity  SKIN: Warm and dry NEUROLOGIC:   Alert and oriented x 3 PSYCHIATRIC:  Normal affect    Signed, Norman Herrlich, MD  03/25/2023 11:52 AM    [facility]

## 2023-03-25 ENCOUNTER — Encounter: Payer: Self-pay | Admitting: Cardiology

## 2023-03-25 ENCOUNTER — Ambulatory Visit: Payer: PPO | Attending: Interventional Cardiology | Admitting: Cardiology

## 2023-03-25 VITALS — BP 136/78 | HR 58 | Resp 16 | Ht 69.0 in | Wt 165.6 lb

## 2023-03-25 DIAGNOSIS — Z952 Presence of prosthetic heart valve: Secondary | ICD-10-CM

## 2023-03-25 DIAGNOSIS — Z951 Presence of aortocoronary bypass graft: Secondary | ICD-10-CM

## 2023-03-25 DIAGNOSIS — I10 Essential (primary) hypertension: Secondary | ICD-10-CM | POA: Diagnosis not present

## 2023-03-25 DIAGNOSIS — I25118 Atherosclerotic heart disease of native coronary artery with other forms of angina pectoris: Secondary | ICD-10-CM

## 2023-03-25 DIAGNOSIS — E782 Mixed hyperlipidemia: Secondary | ICD-10-CM | POA: Diagnosis not present

## 2023-03-25 MED ORDER — ATENOLOL 25 MG PO TABS: 25.0000 mg | ORAL_TABLET | ORAL | 0 refills | Status: DC

## 2023-03-25 NOTE — Addendum Note (Signed)
Addended by: Roxanne Mins I on: 03/25/2023 12:14 PM   Modules accepted: Orders

## 2023-03-25 NOTE — Patient Instructions (Signed)
Medication Instructions:  Your physician has recommended you make the following change in your medication:   START: Atenolol 25 mg every other day for 2 weeks then stop the medication.  *If you need a refill on your cardiac medications before your next appointment, please call your pharmacy*   Lab Work: Your physician recommends that you return for lab work in:   Labs today: CMP, Lipids, Lpa, Apo B  If you have labs (blood work) drawn today and your tests are completely normal, you will receive your results only by: MyChart Message (if you have MyChart) OR A paper copy in the mail If you have any lab test that is abnormal or we need to change your treatment, we will call you to review the results.   Testing/Procedures: Your physician has requested that you have an echocardiogram. Echocardiography is a painless test that uses sound waves to create images of your heart. It provides your doctor with information about the size and shape of your heart and how well your heart's chambers and valves are working. This procedure takes approximately one hour. There are no restrictions for this procedure. Please do NOT wear cologne, perfume, aftershave, or lotions (deodorant is allowed). Please arrive 15 minutes prior to your appointment time.    Follow-Up: At [facility], you and your health needs are our priority.  As part of our continuing mission to provide you with exceptional heart care, we have created designated Provider Care Teams.  These Care Teams include your primary Cardiologist (physician) and Advanced Practice Providers (APPs -  Physician Assistants and Nurse Practitioners) who all work together to provide you with the care you need, when you need it.  We recommend signing up for the patient portal called "MyChart".  Sign up information is provided on this After Visit Summary.  MyChart is used to connect with patients for Virtual Visits (Telemedicine).  Patients are able to  view lab/test results, encounter notes, upcoming appointments, etc.  Non-urgent messages can be sent to your provider as well.   To learn more about what you can do with MyChart, go to [email or web address].    Your next appointment:   1 year(s)  Provider:   Norman Herrlich, MD    Other Instructions None

## 2023-03-26 ENCOUNTER — Encounter: Payer: Self-pay | Admitting: Cardiology

## 2023-03-26 LAB — LIPID PANEL
Chol/HDL Ratio: 2.8 ratio (ref 0.0–5.0)
Cholesterol, Total: 134 mg/dL (ref 100–199)
HDL: 48 mg/dL (ref >39)
LDL Chol Calc (NIH): 74 mg/dL (ref 0–99)
Triglycerides: 54 mg/dL (ref 0–149)
VLDL Cholesterol Cal: 12 mg/dL (ref 5–40)

## 2023-03-26 LAB — COMPREHENSIVE METABOLIC PANEL
ALT: 20 [IU]/L (ref 0–44)
AST: 25 [IU]/L (ref 0–40)
Albumin: 4.7 g/dL (ref 3.8–4.8)
Alkaline Phosphatase: 59 [IU]/L (ref 44–121)
BUN/Creatinine Ratio: 14 (ref 10–24)
BUN: 17 mg/dL (ref 8–27)
Bilirubin Total: 0.6 mg/dL (ref 0.0–1.2)
CO2: 24 mmol/L (ref 20–29)
Calcium: 9.8 mg/dL (ref 8.6–10.2)
Chloride: 104 mmol/L (ref 96–106)
Creatinine, Ser: 1.24 mg/dL (ref 0.76–1.27)
Globulin, Total: 2.2 g/dL (ref 1.5–4.5)
Glucose: 114 mg/dL — ABNORMAL HIGH (ref 70–99)
Potassium: 4.8 mmol/L (ref 3.5–5.2)
Sodium: 144 mmol/L (ref 134–144)
Total Protein: 6.9 g/dL (ref 6.0–8.5)
eGFR: 62 mL/min/{1.73_m2} (ref >59)

## 2023-03-26 LAB — LIPOPROTEIN A (LPA): Lipoprotein (a): 113 nmol/L — ABNORMAL HIGH (ref <75.0)

## 2023-03-26 LAB — APOLIPOPROTEIN B: Apolipoprotein B: 74 mg/dL (ref <90)

## 2023-04-02 ENCOUNTER — Other Ambulatory Visit: Payer: Self-pay

## 2023-04-02 MED ORDER — EZETIMIBE 10 MG PO TABS: 10.0000 mg | ORAL_TABLET | Freq: Every day | ORAL | 3 refills | Status: DC

## 2023-04-05 ENCOUNTER — Other Ambulatory Visit (INDEPENDENT_AMBULATORY_CARE_PROVIDER_SITE_OTHER): Payer: Self-pay | Admitting: Interventional Cardiology

## 2023-04-08 ENCOUNTER — Telehealth: Payer: Self-pay | Admitting: Cardiology

## 2023-04-08 NOTE — Telephone Encounter (Signed)
*  STAT* If patient is at the pharmacy, call can be transferred to refill team.   1. Which medications need to be refilled? (please list name of each medication and dose if known) lisinopril (ZESTRIL) 40 MG tablet   2. Which pharmacy/location (including street and city if local pharmacy) is medication to be sent to?[id number] - [place], [place] - [address] Phone: [phone]  Fax: [phone]    3. Do they need a 30 day or 90 day supply? 90

## 2023-04-09 MED ORDER — LISINOPRIL 40 MG PO TABS: 40.0000 mg | ORAL_TABLET | Freq: Every day | ORAL | 2 refills | Status: DC

## 2023-04-09 NOTE — Telephone Encounter (Signed)
Refill of Lisinopril 40 mg sent to [facility] [address] in [place].

## 2023-04-14 ENCOUNTER — Other Ambulatory Visit: Payer: Self-pay | Admitting: Interventional Cardiology

## 2023-06-23 ENCOUNTER — Ambulatory Visit: Payer: PPO | Attending: Cardiology

## 2023-06-23 DIAGNOSIS — I10 Essential (primary) hypertension: Secondary | ICD-10-CM | POA: Diagnosis not present

## 2023-06-23 DIAGNOSIS — Z951 Presence of aortocoronary bypass graft: Secondary | ICD-10-CM | POA: Diagnosis not present

## 2023-06-23 DIAGNOSIS — E782 Mixed hyperlipidemia: Secondary | ICD-10-CM

## 2023-06-23 DIAGNOSIS — Z952 Presence of prosthetic heart valve: Secondary | ICD-10-CM | POA: Diagnosis not present

## 2023-06-23 DIAGNOSIS — I25118 Atherosclerotic heart disease of native coronary artery with other forms of angina pectoris: Secondary | ICD-10-CM | POA: Diagnosis not present

## 2023-06-23 LAB — ECHOCARDIOGRAM COMPLETE
AR max vel: 1.35 cm2
AV Area VTI: 1.48 cm2
AV Area mean vel: 1.37 cm2
AV Mean grad: 9.5 mm[Hg]
AV Peak grad: 18.6 mm[Hg]
Ao pk vel: 2.16 m/s
S' Lateral: 3.2 cm

## 2023-12-27 ENCOUNTER — Other Ambulatory Visit: Payer: Self-pay | Admitting: Cardiology

## 2023-12-28 ENCOUNTER — Other Ambulatory Visit: Payer: Self-pay | Admitting: Cardiology

## 2024-01-14 ENCOUNTER — Telehealth: Payer: Self-pay | Admitting: Cardiology

## 2024-01-14 MED ORDER — LISINOPRIL 40 MG PO TABS: 40.0000 mg | ORAL_TABLET | Freq: Every day | ORAL | 0 refills | Status: DC

## 2024-01-14 NOTE — Telephone Encounter (Signed)
 RX sent in

## 2024-01-14 NOTE — Telephone Encounter (Signed)
*  STAT* If patient is at the pharmacy, call can be transferred to refill team.   1. Which medications need to be refilled? (please list name of each medication and dose if known)  lisinopril  (ZESTRIL ) 40 MG tablet    2. Would you like to learn more about the convenience, safety, & potential cost savings by using the [facility]?    3. Are you open to using the [facility] (Type [facility].  ).   4. Which pharmacy/location (including street and city if local pharmacy) is medication to be sent to? [id number] - [place], [place] - [address] AT [address]    5. Do they need a 30 day or 90 day supply? 90 day Has one pill left.

## 2024-02-06 DIAGNOSIS — S81812A Laceration without foreign body, left lower leg, initial encounter: Secondary | ICD-10-CM | POA: Diagnosis not present

## 2024-03-03 ENCOUNTER — Other Ambulatory Visit: Payer: Self-pay

## 2024-03-03 ENCOUNTER — Ambulatory Visit: Attending: Cardiology | Admitting: Cardiology

## 2024-03-03 VITALS — BP 150/70 | HR 70 | Ht 69.0 in | Wt 165.2 lb

## 2024-03-03 DIAGNOSIS — E7841 Elevated Lipoprotein(a): Secondary | ICD-10-CM | POA: Diagnosis not present

## 2024-03-03 DIAGNOSIS — I2510 Atherosclerotic heart disease of native coronary artery without angina pectoris: Secondary | ICD-10-CM | POA: Insufficient documentation

## 2024-03-03 DIAGNOSIS — Z955 Presence of coronary angioplasty implant and graft: Secondary | ICD-10-CM | POA: Insufficient documentation

## 2024-03-03 DIAGNOSIS — R519 Headache, unspecified: Secondary | ICD-10-CM | POA: Insufficient documentation

## 2024-03-03 DIAGNOSIS — R0600 Dyspnea, unspecified: Secondary | ICD-10-CM | POA: Insufficient documentation

## 2024-03-03 DIAGNOSIS — R0982 Postnasal drip: Secondary | ICD-10-CM | POA: Insufficient documentation

## 2024-03-03 DIAGNOSIS — Z952 Presence of prosthetic heart valve: Secondary | ICD-10-CM

## 2024-03-03 DIAGNOSIS — I25118 Atherosclerotic heart disease of native coronary artery with other forms of angina pectoris: Secondary | ICD-10-CM

## 2024-03-03 DIAGNOSIS — Z951 Presence of aortocoronary bypass graft: Secondary | ICD-10-CM

## 2024-03-03 DIAGNOSIS — Z87442 Personal history of urinary calculi: Secondary | ICD-10-CM | POA: Insufficient documentation

## 2024-03-03 DIAGNOSIS — I10 Essential (primary) hypertension: Secondary | ICD-10-CM

## 2024-03-03 DIAGNOSIS — K469 Unspecified abdominal hernia without obstruction or gangrene: Secondary | ICD-10-CM | POA: Insufficient documentation

## 2024-03-03 DIAGNOSIS — E782 Mixed hyperlipidemia: Secondary | ICD-10-CM | POA: Insufficient documentation

## 2024-03-03 MED ORDER — MAGNESIUM 200 MG PO TABS: 200.0000 mg | ORAL_TABLET | Freq: Every day | ORAL | 3 refills | Status: AC

## 2024-03-03 NOTE — Patient Instructions (Addendum)
 Medication Instructions:  Your physician has recommended you make the following change in your medication:   START: Magnesium  200 mg daily   *If you need a refill on your cardiac medications before your next appointment, please call your pharmacy*  Lab Work: Your physician recommends that you return for lab work in:   Labs today: CMP, lipids, Apo B, Magnesium   If you have labs (blood work) drawn today and your tests are completely normal, you will receive your results only by: MyChart Message (if you have MyChart) OR A paper copy in the mail If you have any lab test that is abnormal or we need to change your treatment, we will call you to review the results.  Testing/Procedures: None  Follow-Up: At [facility], you and your health needs are our priority.  As part of our continuing mission to provide you with exceptional heart care, our providers are all part of one team.  This team includes your primary Cardiologist (physician) and Advanced Practice Providers or APPs (Physician Assistants and Nurse Practitioners) who all work together to provide you with the care you need, when you need it.  Your next appointment:   1 year(s)  Provider:   Redell Leiter, MD    We recommend signing up for the patient portal called MyChart.  Sign up information is provided on this After Visit Summary.  MyChart is used to connect with patients for Virtual Visits (Telemedicine).  Patients are able to view lab/test results, encounter notes, upcoming appointments, etc.  Non-urgent messages can be sent to your provider as well.   To learn more about what you can do with MyChart, go to [email or web address].   Other Instructions None            [facility]  Tips to measure your blood pressure correctly  To determine whether you have hypertension, a medical professional will take a blood pressure reading. How you prepare for the test, the position of your arm, and other factors can  change a blood pressure reading by 10% or more. That could be enough to hide high blood pressure, start you on a drug you don't really need, or lead your doctor to incorrectly adjust your medications. National and international guidelines offer specific instructions for measuring blood pressure. If a doctor, nurse, or medical assistant isn't doing it right, don't hesitate to ask him or her to get with the guidelines. Here's what you can do to ensure a correct reading:  Don't drink a caffeinated beverage or smoke during the 30 minutes before the test.  Sit quietly for five minutes before the test begins.  During the measurement, sit in a chair with your feet on the floor and your arm supported so your elbow is at about heart level.  The inflatable part of the cuff should completely cover at least 80% of your upper arm, and the cuff should be placed on bare skin, not over a shirt.  Don't talk during the measurement.  Have your blood pressure measured twice, with a brief break in between. If the readings are different by 5 points or more, have it done a third time. There are times to break these rules. If you sometimes feel lightheaded when getting out of bed in the morning or when you stand after sitting, you should have your blood pressure checked while seated and then while standing to see if it falls from one position to the next. Because blood pressure varies throughout the day, your doctor will rarely  diagnose hypertension on the basis of a single reading. Instead, he or she will want to confirm the measurements on at least two occasions, usually within a few weeks of one another. The exception to this rule is if you have a blood pressure reading of 180/110 mm Hg or higher. A result this high usually calls for prompt treatment. It's also a good idea to have your blood pressure measured in both arms at least once, since the reading in one arm (usually the right) may be higher than that in the left. A  2014 study in The American Journal of Medicine of nearly 3,400 people found average arm- to-arm differences in systolic blood pressure of about 5 points. The higher number should be used to make treatment decisions. In 2017, new guidelines from the American Heart Association, the [employer] of Cardiology, and nine other health organizations lowered the diagnosis of high blood pressure to 130/80 mm Hg or higher for all adults. The guidelines also redefined the various blood pressure categories to now include normal, elevated, Stage 1 hypertension, Stage 2 hypertension, and hypertensive crisis (see Blood pressure categories). Blood pressure categories  Blood pressure category SYSTOLIC (upper number)  DIASTOLIC (lower number)  Normal Less than 120 mm Hg and Less than 80 mm Hg  Elevated 120-129 mm Hg and Less than 80 mm Hg  High blood pressure: Stage 1 hypertension 130-139 mm Hg or 80-89 mm Hg  High blood pressure: Stage 2 hypertension 140 mm Hg or higher or 90 mm Hg or higher  Hypertensive crisis (consult your doctor immediately) Higher than 180 mm Hg and/or Higher than 120 mm Hg  Source: American Heart Association and American Stroke Association. For more on getting your blood pressure under control, buy Controlling Your Blood Pressure, a Special Health Report from [facility].

## 2024-03-03 NOTE — Progress Notes (Signed)
 Cardiology Office Note:    Date:  03/03/2024   ID:  Joseph Morse, DOB 1951-05-10, MRN [id number]  PCP:  Joseph Elsie SAUNDERS, MD  Cardiologist:  Joseph Leiter, MD    Referring MD: Joseph Elsie SAUNDERS, MD    ASSESSMENT:    1. Coronary artery disease involving native coronary artery of native heart with other form of angina pectoris   2. S/P CABG x 2   3. S/P AVR   4. Essential hypertension, benign   5. Elevated lipoprotein(a)    PLAN:    In order of problems listed above:  He continues to do well CAD no anginal discomfort [place]  Heart Association class I good lifestyle good healthcare knowledge and continue long-term dual antiplatelet and high intensity statin plus Zetia  Stable after AVR echo last year normal valve function Well-controlled continue ACE inhibitor Home self monitoring Recheck lipids with a combination of Zetia  and rosuvastatin  goal LDL less than 50-55 also check ApoB   Next appointment: 1 year   Medication Adjustments/Labs and Tests Ordered: Current medicines are reviewed at length with the patient today.  Concerns regarding medicines are outlined above.  No orders of the defined types were placed in this encounter.  No orders of the defined types were placed in this encounter.    History of Present Illness:    Joseph Morse is a 73 y.o. male with a hx of CAD with PCI of the right coronary  2001 CABG March 2020 bioprosthetic AVR hypertension anemia and post operative atrial for relation last seen 03/25/2023.  His last visit today was checked and it is elevated.  His 5-year cardiogram was performed 2025 showing normal bioprosthetic AVR function EF 55 to 60%.  Compliance with diet, lifestyle and medications: Yes   He enjoys life active golfs and has no cardiovascular symptoms of edema shortness of breath chest pain palpitation or syncope Hot humid weather he has muscle cramping I told him that oral magnesium  will check a level  Blood pressure runs less  than 125 /60-70 and he has a smart watch and has had no alerts or documented arrhythmia. Past Medical History:  Diagnosis Date   Chest pain    Coronary artery disease    Dyspnea    Headache    migraines   Hernia, abdominal    x 2   History of kidney stones    HTN (hypertension)    Hyperlipidemia, mixed    Post-nasal drip    S/P right coronary artery (RCA) stent placement    Severe aortic stenosis     Current Medications: Current Meds  Medication Sig   aspirin  EC 81 MG tablet Take 81 mg by mouth daily.   clopidogrel  (PLAVIX ) 75 MG tablet TAKE 1 TABLET BY MOUTH EVERY DAY   ezetimibe  (ZETIA ) 10 MG tablet Take 1 tablet (10 mg total) by mouth daily.   lisinopril  (ZESTRIL ) 40 MG tablet Take 1 tablet (40 mg total) by mouth daily.   nitroGLYCERIN  (NITROSTAT ) 0.4 MG SL tablet Place 1 tablet (0.4 mg total) under the tongue every 5 (five) minutes as needed.   oxymetazoline (AFRIN) 0.05 % nasal spray Place 3 sprays into both nostrils 2 (two) times daily.   rosuvastatin  (CRESTOR ) 40 MG tablet TAKE 1 TABLET BY MOUTH EVERY DAY      EKGs/Labs/Other Studies Reviewed:    The following studies were reviewed today:  Cardiac Studies & Procedures   ______________________________________________________________________________________________ CARDIAC CATHETERIZATION  CARDIAC CATHETERIZATION 07/13/2018  Conclusion  Mid RCA  stent is patent with mild in-stent restenosis  Prox RCA lesion is 30% stenosed.  Ost RPDA lesion is 25% stenosed.  Prox Cx lesion is 80% stenosed. This lesion appears unchanged from the 2010 cardiac cath.  Ost Cx to Prox Cx lesion is 50% stenosed.  Mid Cx to Dist Cx lesion is 90% stenosed. This lesion appears unchanged from the 2010 cardiac cath.  2nd Mrg lesion is 75% stenosed. Lat 2nd Mrg lesion is 75% stenosed. This bifurcation area in the OM represents new disease compared to 2010.  Prox LAD lesion is 25% stenosed.  Unable to cross aortic valve despite use of  straight wire with AL-1 catheter.  Given the dramatic worsening of his symptoms, and the changes on his echocardiogram in terms of his aortic stenosis from 2018, I suspect his symptoms are coming more from aortic stenosis rather than coronary artery disease.  He has only had mild progression of his coronary disease over 10 years.  Will refer to surgery for aortic valve replacement with bypass to the circumflex territory.  He will need to avoid any strenuous activities given the presyncope that he has felt, as he walks up hills.  Continue Plavix  for now.  Once he sees the surgeon and a date is set for surgery, they can hold the Plavix  5 days prior to surgery, at that point.  Findings Coronary Findings Diagnostic  Dominance: Right  Left Anterior Descending There is mild diffuse disease throughout the vessel. Prox LAD lesion is 25% stenosed. The lesion is eccentric. The lesion is moderately calcified.  Left Circumflex Ost Cx to Prox Cx lesion is 50% stenosed. Prox Cx lesion is 80% stenosed. Mid Cx to Dist Cx lesion is 90% stenosed.  Second Obtuse Marginal Branch 2nd Mrg lesion is 75% stenosed.  Lateral Second Obtuse Marginal Branch Lat 2nd Mrg lesion is 75% stenosed.  Right Coronary Artery Prox RCA lesion is 30% stenosed. Mid RCA lesion is 30% stenosed. The lesion was previously treated using a drug eluting stent over 2 years ago.  Right Posterior Descending Artery Ost RPDA lesion is 25% stenosed.  Intervention  No interventions have been documented.   STRESS TESTS  NM MYOCAR MULTI W/SPECT W 10/03/2008   ECHOCARDIOGRAM  ECHOCARDIOGRAM COMPLETE 06/23/2023  Narrative ECHOCARDIOGRAM REPORT    Patient Name:   Joseph Morse Date of Exam: 06/23/2023 Medical Rec #:  [id number]        Height:       69.0 in Accession #:    [id number]       Weight:       165.6 lb Date of Birth:  29-May-1950        BSA:          1.907 m Patient Age:    72 years         BP:           136/78  mmHg Patient Gender: M                HR:           63 bpm. Exam Location:  [facility]  Procedure: 2D Echo, Cardiac Doppler, Color Doppler and Strain Analysis  Indications:    S/P Aortic Valve replacement Z95.2  History:        Patient has prior history of Echocardiogram examinations, most recent 11/15/2018. CAD, Prior CABG; Risk Factors:Hypertension and Dyslipidemia. Aortic Valve: 25 mm Edwards valve is present in the aortic position. Procedure Date: March 2020.  Sonographer:  Lynwood Silvas RDCS Referring Phys: [id number] Deandrae Wajda J Marland Reine  IMPRESSIONS   1. Left ventricular ejection fraction, by estimation, is 55 to 60%. The left ventricle has normal function. The left ventricle has no regional wall motion abnormalities. There is mild left ventricular hypertrophy. Left ventricular diastolic parameters are consistent with Grade II diastolic dysfunction (pseudonormalization). The average left ventricular global longitudinal strain is -16.0 %. The global longitudinal strain is abnormal. 2. Right ventricular systolic function is normal. The right ventricular size is normal. There is normal pulmonary artery systolic pressure. 3. Left atrial size was moderately dilated. 4. The mitral valve is normal in structure. Mild mitral valve regurgitation. No evidence of mitral stenosis. 5. The aortic valve has been repaired/replaced. Aortic valve regurgitation is trivial. No aortic stenosis is present. There is a 25 mm Edwards valve present in the aortic position. Procedure Date: March 2020. Echo findings are consistent with normal structure and function of the aortic valve prosthesis. Aortic valve area, by VTI measures 1.48 cm. Aortic valve mean gradient measures 9.5 mmHg. 6. The inferior vena cava is normal in size with greater than 50% respiratory variability, suggesting right atrial pressure of 3 mmHg.  FINDINGS Left Ventricle: Left ventricular ejection fraction, by estimation, is 55 to 60%. The left  ventricle has normal function. The left ventricle has no regional wall motion abnormalities. The average left ventricular global longitudinal strain is -16.0 %. The global longitudinal strain is abnormal. The left ventricular internal cavity size was normal in size. There is mild left ventricular hypertrophy. Left ventricular diastolic parameters are consistent with Grade II diastolic dysfunction (pseudonormalization).  Right Ventricle: The right ventricular size is normal. No increase in right ventricular wall thickness. Right ventricular systolic function is normal. There is normal pulmonary artery systolic pressure. The tricuspid regurgitant velocity is 2.07 m/s, and with an assumed right atrial pressure of 3 mmHg, the estimated right ventricular systolic pressure is 20.1 mmHg.  Left Atrium: Left atrial size was moderately dilated.  Right Atrium: Right atrial size was normal in size.  Pericardium: There is no evidence of pericardial effusion.  Mitral Valve: The mitral valve is normal in structure. Mild mitral valve regurgitation. No evidence of mitral valve stenosis.  Tricuspid Valve: The tricuspid valve is normal in structure. Tricuspid valve regurgitation is mild . No evidence of tricuspid stenosis.  Aortic Valve: The aortic valve has been repaired/replaced. Aortic valve regurgitation is trivial. No aortic stenosis is present. Aortic valve mean gradient measures 9.5 mmHg. Aortic valve peak gradient measures 18.6 mmHg. Aortic valve area, by VTI measures 1.48 cm. There is a 25 mm Edwards valve present in the aortic position. Procedure Date: March 2020. Echo findings are consistent with normal structure and function of the aortic valve prosthesis.  Pulmonic Valve: The pulmonic valve was normal in structure. Pulmonic valve regurgitation is not visualized. No evidence of pulmonic stenosis.  Aorta: The aortic root is normal in size and structure.  Venous: The inferior vena cava is normal in  size with greater than 50% respiratory variability, suggesting right atrial pressure of 3 mmHg.  IAS/Shunts: No atrial level shunt detected by color flow Doppler.   LEFT VENTRICLE PLAX 2D LVIDd:         4.50 cm   Diastology LVIDs:         3.20 cm   LV e' medial:    6.22 cm/s LV PW:         1.30 cm   LV E/e' medial:  15.2 LV IVS:  1.30 cm   LV e' lateral:   12.80 cm/s LVOT diam:     2.00 cm   LV E/e' lateral: 7.4 LV SV:         71 LV SV Index:   37        2D Longitudinal Strain LVOT Area:     3.14 cm  2D Strain GLS Avg:     -16.0 %   RIGHT VENTRICLE             IVC RV Basal diam:  3.30 cm     IVC diam: 1.70 cm RV S prime:     11.80 cm/s TAPSE (M-mode): 1.6 cm  LEFT ATRIUM              Index        RIGHT ATRIUM           Index LA diam:        3.70 cm  1.94 cm/m   RA Area:     19.70 cm LA Vol (A2C):   110.0 ml 57.69 ml/m  RA Volume:   53.70 ml  28.16 ml/m LA Vol (A4C):   98.2 ml  51.50 ml/m LA Biplane Vol: 108.0 ml 56.64 ml/m AORTIC VALVE AV Area (Vmax):    1.35 cm AV Area (Vmean):   1.37 cm AV Area (VTI):     1.48 cm AV Vmax:           215.75 cm/s AV Vmean:          142.250 cm/s AV VTI:            0.479 m AV Peak Grad:      18.6 mmHg AV Mean Grad:      9.5 mmHg LVOT Vmax:         92.50 cm/s LVOT Vmean:        61.850 cm/s LVOT VTI:          0.225 m LVOT/AV VTI ratio: 0.47  AORTA Ao Root diam: 3.30 cm Ao Asc diam:  3.50 cm Ao Desc diam: 2.10 cm  MV E velocity: 94.60 cm/s  TRICUSPID VALVE MV A velocity: 73.60 cm/s  TR Peak grad:   17.1 mmHg MV E/A ratio:  1.29        TR Vmax:        207.00 cm/s  SHUNTS Systemic VTI:  0.22 m Systemic Diam: 2.00 cm  Lamar Fitch MD Electronically signed by Lamar Fitch MD Signature Date/Time: 06/23/2023/4:51:18 PM    Final   TEE  ECHO INTRAOPERATIVE TEE 07/30/2018  Narrative *INTRAOPERATIVE TRANSESOPHAGEAL REPORT *    Patient Name:   READE TREFZ Date of Exam: 07/30/2018 Medical Rec #:  [id number]         Height:       69.5 in Accession #:    [id number]       Weight:       170.7 lb Date of Birth:  [birth year]        BSA:          1.94 m Patient Age:    67 years         BP:           170/63 mmHg Patient Gender: M                HR:           50 bpm. Exam Location:  Inpatient  Transesophogeal exam was perform intraoperatively during surgical procedure. Patient  was closely monitored under general anesthesia during the entirety of examination.  Indications:     CABG. Aortic Valve Replacement History:         CAD, Severe AS; Risk Factors: Hypertension and Dyslipidemia. Performing Phys: [id number] DORISE MARLA FELLERS Diagnosing Phys: Elsie Needle MD  Complications: No known complications during this procedure. POST-OP IMPRESSIONS - Left Ventricle: The left ventricle is unchanged from pre-bypass. - Aortic Valve: No stenosis present. A bioprosthetic valve was placed, leaflets thin and leaflets are freely mobile. - Mitral Valve: The mitral valve appears unchanged from pre-bypass. - Tricuspid Valve: The tricuspid valve appears unchanged from pre-bypass. - Comments: Mean gradient across the AV is now .  PRE-OP FINDINGS Left Ventricle: The left ventricle has normal systolic function, with an ejection fraction of 60-65%. The cavity size was mildly dilated. There is mildly increased left ventricular wall thickness. Right Ventricle: The right ventricle has normal systolic function. The cavity was normal. There is no increase in right ventricular wall thickness. Left Atrium: Left atrial size was normal in size. Right Atrium: Right atrial size was normal in size. Right atrial pressure is estimated at 10 mmHg. Interatrial Septum: Evidence of atrial level shunting detected by color flow Doppler. A small patent foramen ovale is detected with predominantly left to right shunting across the atrial septum. Pericardium: There is no evidence of pericardial effusion. Mitral Valve: The mitral valve is  normal in structure. No thickening of the mitral valve leaflet. No calcification of the mitral valve leaflet. Mitral valve regurgitation is trivial by color flow Doppler. Tricuspid Valve: The tricuspid valve was normal in structure. Tricuspid valve regurgitation was not visualized by color flow Doppler. Aortic Valve: The aortic valveis bicuspid There is Severely thickening of the aortic valve and There is Severe calcifcation of the aortic valve Aortic valve regurgitation is moderate by color flow Doppler. The jet is anteriorly-directed. There is severe stenosis of the aortic valve, with a calculated valve area of 1.52 cm. Pulmonic Valve: The pulmonic valve was normal in structure. Pulmonic valve regurgitation is not visualized by color flow Doppler.  LEFT VENTRICLE PLAX 2D (Teich) LV EF:          70.4 % LVIDd:          5.70 cm LVIDs:          3.40 cm LVOT diam:      2.10 cm LV SV:          113 ml LVOT Area:      3.46 cm  AORTIC VALVE AV Area (Vmax):    1.67 cm AV Area (Vmean):   1.37 cm AV Area (VTI):     1.52 cm AV Vmax:           310.50 cm/s AV Vmean:          209.000 cm/s AV VTI:            0.859 m AV Peak Grad:      38.6 mmHg AV Mean Grad:      25.0 mmHg LVOT Vmax:         150.00 cm/s LVOT Vmean:        82.500 cm/s LVOT VTI:          0.376 m LVOT/AV VTI ratio: 0.44   Elsie Needle MD Electronically signed by Elsie Needle MD Signature Date/Time: 07/30/2018/3:09:24 PM    Final  MONITORS  LONG TERM MONITOR (3-14 DAYS) 08/07/2020  Narrative  Normal sinus rhythm with rare PACs and PVCs.  Short episodes of SVT, up to 28 seconds.  No atrial fibrillation noted.  Average HR of 57.  Difficult to add more rate slowing drugs given low average HR.   Patch Wear Time:  13 days and 14 hours (2022-02-21T19:52:58-0500 to 2022-03-07T10:23:31-0500)  Patient had a min HR of 42 bpm, max HR of 138 bpm, and avg HR of 57 bpm. Predominant underlying rhythm was Sinus  Rhythm. 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 39 Supraventricular Tachycardia runs occurred, the run with the fastest interval lasting 7 beats with a max rate of 138 bpm, the longest lasting 27.9 secs with an avg rate of 91 bpm. Isolated SVEs were rare (<1.0%), SVE Couplets were rare (<1.0%), and SVE Triplets were rare (<1.0%). Isolated VEs were rare (<1.0%), VE Couplets were rare (<1.0%), and no VE Triplets were present.       ______________________________________________________________________________________________          Recent Labs: 03/25/2023: ALT 20; BUN 17; Creatinine, Ser 1.24; Potassium 4.8; Sodium 144  Recent Lipid Panel    Component Value Date/Time   CHOL 134 03/25/2023 1458   TRIG 54 03/25/2023 1458   HDL 48 03/25/2023 1458   CHOLHDL 2.8 03/25/2023 1458   CHOLHDL 2.5 04/24/2016 0911   VLDL 9 04/24/2016 0911   LDLCALC 74 03/25/2023 1458    Physical Exam:    VS:  BP (!) 150/70   Pulse 70   Ht 5' 9 (1.753 m)   Wt 165 lb 3.2 oz (74.9 kg)   SpO2 99%   BMI 24.40 kg/m     Wt Readings from Last 3 Encounters:  03/03/24 165 lb 3.2 oz (74.9 kg)  03/25/23 165 lb 9.6 oz (75.1 kg)  09/24/21 170 lb (77.1 kg)     GEN:  Well nourished, well developed in no acute distress HEENT: Normal NECK: No JVD; No carotid bruits LYMPHATICS: No lymphadenopathy CARDIAC: RRR, no murmurs, rubs, gallops RESPIRATORY:  Clear to auscultation without rales, wheezing or rhonchi  ABDOMEN: Soft, non-tender, non-distended MUSCULOSKELETAL:  No edema; No deformity  SKIN: Warm and dry NEUROLOGIC:  Alert and oriented x 3 PSYCHIATRIC:  Normal affect    Signed, Joseph Leiter, MD  03/03/2024 3:01 PM    [facility]

## 2024-03-04 ENCOUNTER — Encounter: Payer: Self-pay | Admitting: Cardiology

## 2024-03-04 LAB — LIPID PANEL
Chol/HDL Ratio: 2.1 ratio (ref 0.0–5.0)
Cholesterol, Total: 96 mg/dL — ABNORMAL LOW (ref 100–199)
HDL: 45 mg/dL (ref >39)
LDL Chol Calc (NIH): 38 mg/dL (ref 0–99)
Triglycerides: 53 mg/dL (ref 0–149)
VLDL Cholesterol Cal: 13 mg/dL (ref 5–40)

## 2024-03-04 LAB — COMPREHENSIVE METABOLIC PANEL WITH GFR
ALT: 20 IU/L (ref 0–44)
AST: 24 IU/L (ref 0–40)
Albumin: 4.5 g/dL (ref 3.8–4.8)
Alkaline Phosphatase: 57 IU/L (ref 47–123)
BUN/Creatinine Ratio: 14 (ref 10–24)
BUN: 17 mg/dL (ref 8–27)
Bilirubin Total: 0.6 mg/dL (ref 0.0–1.2)
CO2: 23 mmol/L (ref 20–29)
Calcium: 9.3 mg/dL (ref 8.6–10.2)
Chloride: 100 mmol/L (ref 96–106)
Creatinine, Ser: 1.19 mg/dL (ref 0.76–1.27)
Globulin, Total: 1.8 g/dL (ref 1.5–4.5)
Glucose: 88 mg/dL (ref 70–99)
Potassium: 4.7 mmol/L (ref 3.5–5.2)
Sodium: 138 mmol/L (ref 134–144)
Total Protein: 6.3 g/dL (ref 6.0–8.5)
eGFR: 64 mL/min/1.73 (ref >59)

## 2024-03-04 LAB — APOLIPOPROTEIN B: Apolipoprotein B: 52 mg/dL (ref <90)

## 2024-03-04 LAB — MAGNESIUM: Magnesium: 2.1 mg/dL (ref 1.6–2.3)

## 2024-03-15 DIAGNOSIS — H353111 Nonexudative age-related macular degeneration, right eye, early dry stage: Secondary | ICD-10-CM | POA: Diagnosis not present

## 2024-03-15 DIAGNOSIS — H25813 Combined forms of age-related cataract, bilateral: Secondary | ICD-10-CM | POA: Diagnosis not present

## 2024-03-15 DIAGNOSIS — H52223 Regular astigmatism, bilateral: Secondary | ICD-10-CM | POA: Diagnosis not present

## 2024-03-15 DIAGNOSIS — H5203 Hypermetropia, bilateral: Secondary | ICD-10-CM | POA: Diagnosis not present

## 2024-03-15 DIAGNOSIS — H524 Presbyopia: Secondary | ICD-10-CM | POA: Diagnosis not present

## 2024-03-27 ENCOUNTER — Other Ambulatory Visit: Payer: Self-pay | Admitting: Cardiology

## 2024-03-31 ENCOUNTER — Other Ambulatory Visit: Payer: Self-pay | Admitting: Cardiology

## 2024-04-05 ENCOUNTER — Ambulatory Visit: Admitting: Cardiology

## 2024-04-10 ENCOUNTER — Other Ambulatory Visit: Payer: Self-pay | Admitting: Cardiology
